# Patient Record
Sex: Female | Born: 1990
Health system: Southern US, Community
[De-identification: ages and names within clinical notes are randomized; demographics above are authoritative.]

## PROBLEM LIST (undated history)

## (undated) ENCOUNTER — Inpatient Hospital Stay (HOSPITAL_COMMUNITY): Payer: Self-pay

## (undated) DIAGNOSIS — D219 Benign neoplasm of connective and other soft tissue, unspecified: Secondary | ICD-10-CM

## (undated) DIAGNOSIS — G932 Benign intracranial hypertension: Secondary | ICD-10-CM

## (undated) DIAGNOSIS — R7303 Prediabetes: Secondary | ICD-10-CM

## (undated) DIAGNOSIS — M549 Dorsalgia, unspecified: Secondary | ICD-10-CM

## (undated) DIAGNOSIS — G43909 Migraine, unspecified, not intractable, without status migrainosus: Secondary | ICD-10-CM

## (undated) DIAGNOSIS — F419 Anxiety disorder, unspecified: Secondary | ICD-10-CM

## (undated) DIAGNOSIS — O24419 Gestational diabetes mellitus in pregnancy, unspecified control: Secondary | ICD-10-CM

## (undated) DIAGNOSIS — F32A Depression, unspecified: Secondary | ICD-10-CM

## (undated) HISTORY — DX: Migraine, unspecified, not intractable, without status migrainosus: G43.909

## (undated) HISTORY — DX: Gestational diabetes mellitus in pregnancy, unspecified control: O24.419

## (undated) HISTORY — DX: Depression, unspecified: F32.A

## (undated) HISTORY — PX: WISDOM TOOTH EXTRACTION: SHX21

## (undated) HISTORY — DX: Anxiety disorder, unspecified: F41.9

## (undated) HISTORY — DX: Prediabetes: R73.03

## (undated) HISTORY — DX: Benign neoplasm of connective and other soft tissue, unspecified: D21.9

## (undated) HISTORY — DX: Benign intracranial hypertension: G93.2

## (undated) HISTORY — DX: Dorsalgia, unspecified: M54.9

---

## 2017-09-26 HISTORY — PX: OOPHORECTOMY: SHX86

## 2019-08-30 DIAGNOSIS — Z01419 Encounter for gynecological examination (general) (routine) without abnormal findings: Secondary | ICD-10-CM | POA: Diagnosis not present

## 2021-04-19 DIAGNOSIS — L700 Acne vulgaris: Secondary | ICD-10-CM | POA: Diagnosis not present

## 2021-04-19 DIAGNOSIS — L219 Seborrheic dermatitis, unspecified: Secondary | ICD-10-CM | POA: Diagnosis not present

## 2021-05-17 ENCOUNTER — Other Ambulatory Visit: Payer: Self-pay

## 2021-05-17 ENCOUNTER — Ambulatory Visit: Payer: Federal, State, Local not specified - PPO | Admitting: Family Medicine

## 2021-05-17 VITALS — BP 110/58 | HR 81 | Ht 63.0 in | Wt 219.1 lb

## 2021-05-17 DIAGNOSIS — Z3189 Encounter for other procreative management: Secondary | ICD-10-CM

## 2021-05-17 DIAGNOSIS — G932 Benign intracranial hypertension: Secondary | ICD-10-CM

## 2021-05-17 DIAGNOSIS — F32A Depression, unspecified: Secondary | ICD-10-CM

## 2021-05-17 MED ORDER — PRENATAL VITAMIN 27-0.8 MG PO TABS: 1.0000 | ORAL_TABLET | Freq: Every day | ORAL | 3 refills | Status: DC

## 2021-05-17 NOTE — Assessment & Plan Note (Signed)
Takes Wellbutrin 300 mg daily. Continue current medication

## 2021-05-17 NOTE — Assessment & Plan Note (Signed)
Takes Diamox 250 mg BID. Has sumatriptan 50 mg as needed for headaches. Followed previously by Dr. Walker Kehr at [facility] in [place], [place]. Referral for [place] neurologist placed.

## 2021-05-17 NOTE — Patient Instructions (Signed)
Thank you for coming into the office today.   Continue taking your medications daily!   Take Care,   Dr. Susa Simmonds

## 2021-05-17 NOTE — Progress Notes (Signed)
   SUBJECTIVE:   CHIEF COMPLAINT / HPI:   Chief Complaint  Patient presents with   Lori Compton is a 30 y.o. female here to establish care.  Follow [facility] at [facility] in [place], [place].     New Patient : PMHx and Meds : idiopathic cranial hypertension (Diamox), depression (Wellbutrin),  headaches (Sumatriptan)    ALL: no known drug allergies    SurgHx: right oophorectomy 2019   GYNHx: Patient is not currently using birth control. [id number]. She does want to have another baby. She had a right oothectomy in May 2019 in [place] as it was twisted.  Patient's last menstrual period was 05/15/2021. Periods last about 5 days and come about every 30 days.     FMHx: maternal grandmother and mother - HTN and diabetes    Social Hx:  Lives with son and her fiance.  Go to [employer] three times a week for exercise. No tobacco use. Drinks wine once a week.  Denies illicit use.      PERTINENT  PMH / PSH: reviewed and updated as appropriate   OBJECTIVE:   BP (!) 110/58   Pulse 81   Ht '5\' 3"'$  (1.6 m)   Wt 219 lb 2 oz (99.4 kg)   LMP 05/15/2021   SpO2 96%   BMI 38.82 kg/m    GEN: pleasant well appearing female, in no acute distress  CV: regular rate and rhythm, no murmurs appreciated  RESP: no increased work of breathing, clear to ascultation bilaterally with no crackles, wheezes, or rhonchi ABD: Bowel sounds present. Soft, Nontender, Nondistended.  MSK: no edema, or cyanosis noted SKIN: warm, dry NEURO: moves all extremities appropriately PSYCH: Normal affect, appropriate speech and behavior    ASSESSMENT/PLAN:   Idiopathic intracranial hypertension Takes Diamox 250 mg BID. Has sumatriptan 50 mg as needed for headaches. Followed previously by Dr. Walker Kehr at [facility] in [place], [place]. Referral for [place] neurologist placed.   Depression Takes Wellbutrin 300 mg daily. Continue current medication    Fertility  Pt wanting to get  pregnant in the near future. She has been trying with her fiance for the last 6 months. History of right oophorectomy. Advised to start taking prenatal vitamins. If not pregnant and trying in the next 6 months, refer to fertility specialist.   Lyndee Hensen, DO PGY-3, [facility] 05/17/2021

## 2021-07-06 ENCOUNTER — Encounter: Payer: Self-pay | Admitting: Family Medicine

## 2021-07-06 DIAGNOSIS — Z3189 Encounter for other procreative management: Secondary | ICD-10-CM

## 2021-07-07 ENCOUNTER — Encounter: Payer: Self-pay | Admitting: Neurology

## 2021-07-07 ENCOUNTER — Ambulatory Visit: Payer: Federal, State, Local not specified - PPO | Admitting: Neurology

## 2021-08-03 ENCOUNTER — Ambulatory Visit (INDEPENDENT_AMBULATORY_CARE_PROVIDER_SITE_OTHER): Payer: Federal, State, Local not specified - PPO

## 2021-08-03 ENCOUNTER — Other Ambulatory Visit: Payer: Self-pay

## 2021-08-03 VITALS — BP 150/87 | HR 81 | Ht 63.0 in | Wt 222.8 lb

## 2021-08-03 DIAGNOSIS — Z3202 Encounter for pregnancy test, result negative: Secondary | ICD-10-CM | POA: Diagnosis not present

## 2021-08-03 DIAGNOSIS — Z3201 Encounter for pregnancy test, result positive: Secondary | ICD-10-CM | POA: Diagnosis not present

## 2021-08-03 LAB — POCT PREGNANCY, URINE: Preg Test, Ur: NEGATIVE

## 2021-08-03 NOTE — Progress Notes (Addendum)
Pt dropped off urine today for UPT. UPT is negative.  Pt states had 3 positive clearblue easy UPTs at home. 2 yesterday and 1 this morning.  Pt denies any vaginal bleeding, abd pain or cramps at this time. Pt has hx of right ovarian torsion that resulted in Right ovary removal in 01/2018. Pt then had IAB in 2020. Has 1 child that was born in 2016.  LMP: 07/06/21 EDC: 04/12/22 [redacted]w[redacted]d    Reviewed negative UPT with Earlie Server, NP. Advised to have non-stat beta today and will further advise after results. Pt given recommendations. Pt agreeable to plan of care.  Orders placed for Non-stat beta. Pt also advised will see results through mychart and will be contacted by provider.   Colletta Maryland, RN   Chart reviewed for nurse visit. Agree with plan of care.   [place], NP 08/03/2021 12:10 PM

## 2021-08-04 ENCOUNTER — Telehealth: Payer: Self-pay | Admitting: *Deleted

## 2021-08-04 DIAGNOSIS — O3680X0 Pregnancy with inconclusive fetal viability, not applicable or unspecified: Secondary | ICD-10-CM

## 2021-08-04 DIAGNOSIS — Z3490 Encounter for supervision of normal pregnancy, unspecified, unspecified trimester: Secondary | ICD-10-CM

## 2021-08-04 LAB — BETA HCG QUANT (REF LAB): hCG Quant: 109 m[IU]/mL

## 2021-08-04 NOTE — Telephone Encounter (Signed)
Patient left a voicemail that she received blood pregnancy tests and wasn't sure if she would get a call, but would like a call.  Jaquelynn Wanamaker,RN

## 2021-08-04 NOTE — Telephone Encounter (Signed)
Discussed results with Dr. Damita Dunnings - order given for Lori Compton at 6 weeks . Scheduled for 08/23/21 arrive 1:45 with full bladder for 2:00 appointment. I called [place] and notifed her of plan of care and appointment. She voices understanding. Lenny Fiumara,RN

## 2021-08-23 ENCOUNTER — Other Ambulatory Visit: Payer: Self-pay

## 2021-08-23 ENCOUNTER — Ambulatory Visit (INDEPENDENT_AMBULATORY_CARE_PROVIDER_SITE_OTHER): Payer: Federal, State, Local not specified - PPO | Admitting: *Deleted

## 2021-08-23 ENCOUNTER — Ambulatory Visit
Admission: RE | Admit: 2021-08-23 | Discharge: 2021-08-23 | Disposition: A | Discharge status: Home / Self Care | Payer: Federal, State, Local not specified - PPO | Source: Ambulatory Visit | Attending: Obstetrics and Gynecology | Admitting: Obstetrics and Gynecology

## 2021-08-23 VITALS — BP 126/71 | HR 93 | Ht 64.0 in | Wt 222.7 lb

## 2021-08-23 DIAGNOSIS — O3680X0 Pregnancy with inconclusive fetal viability, not applicable or unspecified: Secondary | ICD-10-CM | POA: Insufficient documentation

## 2021-08-23 DIAGNOSIS — Z3490 Encounter for supervision of normal pregnancy, unspecified, unspecified trimester: Secondary | ICD-10-CM

## 2021-08-23 DIAGNOSIS — Z3A01 Less than 8 weeks gestation of pregnancy: Secondary | ICD-10-CM | POA: Diagnosis not present

## 2021-08-23 DIAGNOSIS — Z3689 Encounter for other specified antenatal screening: Secondary | ICD-10-CM | POA: Diagnosis not present

## 2021-08-23 NOTE — Progress Notes (Signed)
Here for Compton results. Reviewed Compton with Dr. Roselie Awkward and per his instructions informed Lori Compton shows live pregnancy at [redacted]w[redacted]d with 2 fibroids. Explained we recommend she start prenatal care and prenatal vitamins. She is taking PNV already. We discussed the fibroids may or may not cause any issues but if she begins to have pain / bleeding she needs to be evaluated . I encouraged her to discuss with provider at her new ob visit. She would like to start prenatal care with Compton. We discussed her prenatal care options and she choosed centeringpregnancy.  Lori Cregg,RN

## 2021-08-23 NOTE — Patient Instructions (Signed)
Prenatal Care Providers           [facility]  [address] [phone]  [facility]   [address]  [phone]  [facility] [place]       [address] [phone]            [facility]     [address] [phone]          [facility] [place]   [address] [phone]  [facility]  [address] [phone]     [facility]  [address]   [phone]     [facility]  Phone: [phone]  [facility]  Phone: [phone]  [facility] Phone: [phone]  [facility] Phone: [phone]  [facility] Phone: [phone]  [facility] Phone: [phone]  [facility]  Phone: [phone]  

## 2021-08-25 ENCOUNTER — Encounter: Payer: Self-pay | Admitting: *Deleted

## 2021-09-08 ENCOUNTER — Telehealth (INDEPENDENT_AMBULATORY_CARE_PROVIDER_SITE_OTHER): Payer: Federal, State, Local not specified - PPO

## 2021-09-08 DIAGNOSIS — Z3480 Encounter for supervision of other normal pregnancy, unspecified trimester: Secondary | ICD-10-CM

## 2021-09-08 DIAGNOSIS — L309 Dermatitis, unspecified: Secondary | ICD-10-CM

## 2021-09-08 DIAGNOSIS — Z3491 Encounter for supervision of normal pregnancy, unspecified, first trimester: Secondary | ICD-10-CM

## 2021-09-08 DIAGNOSIS — O0990 Supervision of high risk pregnancy, unspecified, unspecified trimester: Secondary | ICD-10-CM | POA: Insufficient documentation

## 2021-09-08 DIAGNOSIS — Z3492 Encounter for supervision of normal pregnancy, unspecified, second trimester: Secondary | ICD-10-CM | POA: Insufficient documentation

## 2021-09-08 NOTE — Patient Instructions (Signed)
°  At our [facility], we work as an integrated team, providing care to address both physical and emotional health. Your medical provider may refer you to see our Behavioral Health Clinician (BHC) on the same day you see your medical provider, as availability permits; often scheduled virtually at your convenience.  Our BHC is available to all patients, visits generally last between 20-30 minutes, but can be longer or shorter, depending on patient need. The BHC offers help with stress management, coping with symptoms of depression and anxiety, major life changes , sleep issues, changing risky behavior, grief and loss, life stress, working on personal life goals, and  behavioral health issues, as these all affect your overall health and wellness.  The BHC is NOT available for the following: FMLA paperwork, court-ordered evaluations, specialty assessments (custody or disability), letters to employers, or obtaining certification for an emotional support animal. The BHC does not provide long-term therapy. You have the right to refuse integrated behavioral health services, or to reschedule to see the BHC at a later date.  Confidentiality exception: If it is suspected that a child or disabled adult is being abused or neglected, we are required by law to report that to either Child Protective Services or Adult Protective Services.  If you have a diagnosis of Bipolar affective disorder, Schizophrenia, or recurrent Major depressive disorder, we will recommend that you establish care with a psychiatrist, as these are lifelong, chronic conditions, and we want your overall emotional health and medications to be more closely monitored. If you anticipate needing extended maternity leave due to mental health issues postpartum, it it recommended you inform your medical provider, so we can put in a referral to a psychiatrist as soon as possible. The BHC is unable to recommend an extended maternity leave for mental  health issues. Your medical provider or BHC may refer you to a therapist for ongoing, traditional therapy, or to a psychiatrist, for medication management, if it would benefit your overall health. Depending on your insurance, you may have a copay or be charged a deductible, depending on your insurance, to see the BHC. If you are uninsured, it is recommended that you apply for financial assistance. (Forms may be requested at the front desk for in-person visits, via MyChart, or request a form during a virtual visit).  If you see the BHC more than 6 times, you will have to complete a comprehensive clinical assessment interview with the BHC to resume integrated services.  For virtual visits with the BHC, you must be physically in the state of [place] at the time of the visit. For example, if you live in [place], you will have to do an in-person visit with the BHC, and your out-of-state insurance may not cover behavioral health services in [place]. If you are going out of the state or country for any reason, the BHC may see you virtually when you return to [place], but not while you are physically outside of [place].    

## 2021-09-08 NOTE — Progress Notes (Signed)
New OB Intake  I connected with  Lori Compton on 09/08/21 at  8:15 AM EST by MyChart Video Visit and verified that I am speaking with the correct person using two identifiers. Nurse is located at [facility] and pt is located at home.  I discussed the limitations, risks, security and privacy concerns of performing an evaluation and management service by telephone and the availability of in person appointments. I also discussed with the patient that there may be a patient responsible charge related to this service. The patient expressed understanding and agreed to proceed.  I explained I am completing New OB Intake today. We discussed her EDD of 04/12/22 that is based on LMP of 07/06/21. Pt is G3/P1011. I reviewed her allergies, medications, Medical/Surgical/OB history, and appropriate screenings. Reports history of depression. Has taken Wellbutrin in the past with no improvement. I informed her of [facility] services. [name] information placed in AVS. Based on history, this is a low risk pregnancy.   Patient Active Problem List   Diagnosis Date Noted   Supervision of low-risk pregnancy, first trimester 09/08/2021   Eczema 09/08/2021   Idiopathic intracranial hypertension 05/17/2021   Depression 05/17/2021   Concerns addressed today  Reports history of idiopathic intracranial hypertension following trauma to head in 2020. Has experienced chronic migraines since that time. Reports not taking any medication currently due to pregnancy. No recent migraines. Not currently established with neurologist. [facility], hoping to receive all care through [place]. Explained I will review with provider and notify her if any need to follow up with neurology sooner.   Delivery Plans Plans to deliver at [name]. MAU discussed for emergencies during pregnancy.  MyChart/Babyscripts MyChart access verified. I explained pt will have some visits in office and some virtually. Babyscripts instructions given and  order placed.  Blood Pressure Cuff  Patient has private insurance; instructed to purchase blood pressure cuff and bring to first prenatal appt. Explained after first prenatal appt pt will check weekly and document in 56.  Anatomy US Explained first scheduled [place] will be around 19 weeks. Anatomy US scheduled for at 11/16/20 @ 1430.  Labs Discussed [name] genetic screening with patient. Would like both Panorama and Horizon drawn at new OB visit. Routine prenatal labs needed.  COVID Vaccine Patient has had [name] vaccine 07/17/2020 & 08/07/2020.  Centering in Pregnancy Candidate?  Patient enrolled in this program. Reviewed with patient who would like to withdraw from program. Pt would like to be able to bring child and partner to prenatal visits.  Mother/ Baby Dyad Candidate?    No - one living child.  Social Determinants of Health Food Insecurity: Patient denies food insecurity. WIC Referral: Patient is not interested in referral to [facility].  Transportation: Patient denies transportation needs. Childcare: Discussed no children allowed at ultrasound appointments. Offered childcare services; patient will notify office if there is need for this.  First visit review I reviewed new OB appt with pt. I explained she will have a provider visit with Gaylan Gerold, CNM that includes physical exam with ob lab work. Patient reports PAP smear in 2021; will have record sent to office. Explained we can complete this at new OB if needed. Explained that patient will be seen by pregnancy navigator following visit with provider.   Annabell Howells, RN 09/08/2021  10:07 AM

## 2021-09-08 NOTE — Progress Notes (Signed)
Patient was assessed and managed by nursing staff during this encounter. I have reviewed the chart and agree with the documentation and plan. Will follow up re migraines and treatment at new OB appointment and see if there are ways we can support her prior to neuro appointment if she prefers to [facility] with [facility].  Gaylan Gerold, MSN, CNM, IBCLC 09/08/21 8:30 PM

## 2021-09-22 ENCOUNTER — Ambulatory Visit (INDEPENDENT_AMBULATORY_CARE_PROVIDER_SITE_OTHER): Payer: Federal, State, Local not specified - PPO | Admitting: Certified Nurse Midwife

## 2021-09-22 ENCOUNTER — Other Ambulatory Visit: Payer: Self-pay

## 2021-09-22 VITALS — BP 122/77 | HR 85 | Wt 222.0 lb

## 2021-09-22 DIAGNOSIS — Z3491 Encounter for supervision of normal pregnancy, unspecified, first trimester: Secondary | ICD-10-CM | POA: Diagnosis not present

## 2021-09-22 DIAGNOSIS — Z3A11 11 weeks gestation of pregnancy: Secondary | ICD-10-CM

## 2021-09-22 LAB — POCT URINALYSIS DIP (DEVICE)
Bilirubin Urine: NEGATIVE
Glucose, UA: NEGATIVE mg/dL
Hgb urine dipstick: NEGATIVE
Ketones, ur: NEGATIVE mg/dL
Leukocytes,Ua: NEGATIVE
Nitrite: NEGATIVE
Protein, ur: NEGATIVE mg/dL
Specific Gravity, Urine: 1.02 (ref 1.005–1.030)
Urobilinogen, UA: 0.2 mg/dL (ref 0.0–1.0)
pH: 7 (ref 5.0–8.0)

## 2021-09-22 NOTE — Patient Instructions (Signed)
Lori Compton  [email or web address]

## 2021-09-22 NOTE — Progress Notes (Signed)
History:   Lori Compton is a 30 y.o. G3P1011 at [redacted]w[redacted]d by LMP being seen today for her first obstetrical visit.  Her obstetrical history is significant for obesity. Patient does not intend to breast feed. Pregnancy history fully reviewed. Was going to participate in Centering Pregnancy has decided against it because she wants her partner to attend visits. She would like to attempt a waterbirth and prefers midwifery care.  Patient reports no complaints.   HISTORY: OB History  Gravida Para Term Preterm AB Living  3 1 1  0 1 1  SAB IAB Ectopic Multiple Live Births  0 1 0 0 1    # Outcome Date GA Lbr Len/2nd Weight Sex Delivery Anes PTL Lv  3 Current           2 IAB 2020          1 Term 08/29/15   7 lb 8 oz (3.402 kg) M Vag-Spont   LIV    Last pap smear was done 08/30/2019 and was normal  No past medical history on file.  Family History  Problem Relation Age of Onset   Thyroid disease Mother    Breast cancer Maternal Grandmother    Hypertension Maternal Grandmother    Heart Problems Maternal Aunt    Social History   Tobacco Use   Smoking status: Never   Smokeless tobacco: Never  Vaping Use   Vaping Use: Never used  Substance Use Topics   Alcohol use: Yes    Alcohol/week: 2.0 standard drinks    Types: 1 Glasses of wine, 1 Cans of beer per week    Comment: either beer or wine, 1/week   Drug use: Never   No Known Allergies Current Outpatient Medications on File Prior to Visit  Medication Sig Dispense Refill   Prenatal Vit-Fe Fumarate-FA (PRENATAL VITAMIN) 27-0.8 MG TABS Take 1 tablet by mouth daily. 30 tablet 3   No current facility-administered medications on file prior to visit.    Review of Systems Pertinent items noted in HPI and remainder of comprehensive ROS otherwise negative. Physical Exam:   Vitals:   09/22/21 0954  BP: 122/77  Pulse: 85  Weight: 222 lb (100.7 kg)    Unable to hear fetal heart rate Bedside Ultrasound for FHR check: Viable  intrauterine pregnancy with positive cardiac activity noted Patient informed that the ultrasound is considered a limited obstetric ultrasound and is not intended to be a complete ultrasound exam.  Patient also informed that the ultrasound is not being completed with the intent of assessing for fetal or placental anomalies or any pelvic abnormalities.  Explained that the purpose of todays ultrasound is to assess for fetal heart rate.  Patient acknowledges the purpose of the exam and the limitations of the study.  Constitutional: Well-developed, well-nourished pregnant female in no acute distress.  HEENT: PERRLA Skin: normal color and turgor, no rash Cardiovascular: normal rate & rhythm Respiratory: normal effort GI: Abd soft, non-tender, gravid appropriate for gestational age MS: Extremities nontender, no edema, normal ROM Neurologic: Alert and oriented x 4.  GU: no CVA tenderness Pelvic: exam deferred  Assessment:    Pregnancy: G3P1011 Patient Active Problem List   Diagnosis Date Noted   Supervision of low-risk pregnancy, first trimester 09/08/2021   Eczema 09/08/2021   Idiopathic intracranial hypertension 05/17/2021   Depression 05/17/2021   Plan:    1. Supervision of low-risk pregnancy, first trimester - Doing well overall, not feeling movement yet.  2. [redacted] weeks gestation of pregnancy -  Routine OB care  - Can come in for a RN visit to check FHT since able to visualize but not hear today - CBC/D/Plt+RPR+Rh+ABO+RubIgG... - Genetic Screening - Hemoglobin A1c - Culture, OB Urine  3. Initial obstetric visit in first trimester - Initial labs drawn. - Continue prenatal vitamins. - Problem list reviewed and updated. - Genetic Screening discussed, First trimester screen, Quad screen, and NIPS: ordered. - Ultrasound discussed; fetal anatomic survey: ordered. - Anticipatory guidance about prenatal visits given including labs, ultrasounds, and testing. - Discussed usage of  Babyscripts and virtual visits as additional source of managing and completing prenatal visits in midst of coronavirus and pandemic.   - Encouraged to complete MyChart Registration for her ability to review results, send requests, and have questions addressed.  - The nature of [place] [facility]/Faculty Practice with multiple MDs and Advanced Practice Providers was explained to patient; also emphasized that residents, students are part of our team. - Routine obstetric precautions reviewed. Encouraged to seek out care at office or emergency room [facility] MAU preferred) for urgent and/or emergent concerns. Return in about 4 weeks (around 10/20/2021) for IN-PERSON, LOB.     Gaylan Gerold, MSN, CNM, [place] Certified Nurse Midwife, [facility]

## 2021-09-23 LAB — CBC/D/PLT+RPR+RH+ABO+RUBIGG...
Antibody Screen: NEGATIVE
Basophils Absolute: 0 10*3/uL (ref 0.0–0.2)
Basos: 0 %
EOS (ABSOLUTE): 0.1 10*3/uL (ref 0.0–0.4)
Eos: 1 %
HCV Ab: <0.1 s/co ratio (ref 0.0–0.9)
HIV Screen 4th Generation wRfx: NONREACTIVE
Hematocrit: 38.8 % (ref 34.0–46.6)
Hemoglobin: 13 g/dL (ref 11.1–15.9)
Hepatitis B Surface Ag: NEGATIVE
Immature Grans (Abs): 0 10*3/uL (ref 0.0–0.1)
Immature Granulocytes: 0 %
Lymphocytes Absolute: 1.7 10*3/uL (ref 0.7–3.1)
Lymphs: 26 %
MCH: 28 pg (ref 26.6–33.0)
MCHC: 33.5 g/dL (ref 31.5–35.7)
MCV: 84 fL (ref 79–97)
Monocytes Absolute: 0.4 10*3/uL (ref 0.1–0.9)
Monocytes: 6 %
Neutrophils Absolute: 4.2 10*3/uL (ref 1.4–7.0)
Neutrophils: 67 %
Platelets: 246 10*3/uL (ref 150–450)
RBC: 4.64 x10E6/uL (ref 3.77–5.28)
RDW: 12.9 % (ref 11.7–15.4)
RPR Ser Ql: NONREACTIVE
Rh Factor: NEGATIVE
Rubella Antibodies, IGG: 3.79 index (ref >0.99)
WBC: 6.3 10*3/uL (ref 3.4–10.8)

## 2021-09-23 LAB — HEMOGLOBIN A1C
Est. average glucose Bld gHb Est-mCnc: 120 mg/dL
Hgb A1c MFr Bld: 5.8 % — ABNORMAL HIGH (ref 4.8–5.6)

## 2021-09-23 LAB — HCV INTERPRETATION

## 2021-09-26 NOTE — L&D Delivery Note (Signed)
OB/GYN Faculty Practice Delivery Note  Lori Compton is a 31 y.o. [id number] at 23w4dadmitted for active labor.   GBS Status: Negative/-- (06/22 1444) Maximum Maternal Temperature: 97.6  Labor course: Initial SVE: 4 cm. Augmentation with: N/A. She then progressed to complete shortly after arriving to L&D room.  ROM: 0h 08mith clear fluid  Birth: At 0553 a viable female was delivered via spontaneous vaginal delivery (Presentation: ROA). Nuchal cord present: No.  Shoulders and body delivered in usual fashion. Infant placed directly on mom's abdomen for bonding/skin-to-skin, baby dried and stimulated. Cord clamped x 2 after 1 minute and cut by Dr. MeWillis Modena Cord blood collected.  The placenta separated spontaneously and delivered via gentle cord traction.  Pitocin infused rapidly IV per protocol.  Fundus firm with massage.  Placenta inspected and appears to be intact with a 3 VC.  Placenta/Cord with the following complications: none.  Cord pH: n/a Sponge and instrument count were correct x2.  Intrapartum complications:  None Anesthesia:  none Episiotomy: none Lacerations:  2nd degree and vaginal Suture Repair: 2.0 vicryl EBL (mL): 164   Infant: APGAR (1 MIN): 9   APGAR (5 MINS): 9   Infant weight: pending  Mom to postpartum.  Baby to Couplet care / Skin to Skin. Placenta to Home with patient   Plans to Breastfeed Contraception: oral progesterone-only contraceptive Circumcision: wants inpatient  [facility].  RoLaury Deep MSN, CNM 04/02/2022 8:37 AM

## 2021-09-27 ENCOUNTER — Encounter: Payer: Self-pay | Admitting: Certified Nurse Midwife

## 2021-09-28 ENCOUNTER — Telehealth: Payer: Self-pay | Admitting: General Practice

## 2021-09-28 NOTE — Telephone Encounter (Signed)
-----   Message from [name], [place] sent at 09/27/2021  8:31 AM EST ----- Please call to have Lori Compton scheduled for an early 2hr GTT, thank you!

## 2021-09-28 NOTE — Telephone Encounter (Signed)
Called patient, no answer- left message to call us back concerning results. Will send mychart message.

## 2021-09-29 ENCOUNTER — Other Ambulatory Visit: Payer: Self-pay

## 2021-09-29 DIAGNOSIS — Z3491 Encounter for supervision of normal pregnancy, unspecified, first trimester: Secondary | ICD-10-CM

## 2021-10-01 ENCOUNTER — Other Ambulatory Visit: Payer: Federal, State, Local not specified - PPO

## 2021-10-01 ENCOUNTER — Other Ambulatory Visit: Payer: Self-pay

## 2021-10-01 DIAGNOSIS — Z3491 Encounter for supervision of normal pregnancy, unspecified, first trimester: Secondary | ICD-10-CM | POA: Diagnosis not present

## 2021-10-02 LAB — GLUCOSE TOLERANCE, 2 HOURS W/ 1HR
Glucose, 1 hour: 149 mg/dL (ref 70–179)
Glucose, 2 hour: 154 mg/dL — ABNORMAL HIGH (ref 70–152)
Glucose, Fasting: 81 mg/dL (ref 70–91)

## 2021-10-04 ENCOUNTER — Encounter: Payer: Self-pay | Admitting: Certified Nurse Midwife

## 2021-10-04 ENCOUNTER — Other Ambulatory Visit: Payer: Self-pay | Admitting: Certified Nurse Midwife

## 2021-10-06 ENCOUNTER — Telehealth: Payer: Self-pay

## 2021-10-06 NOTE — Telephone Encounter (Addendum)
-----   Message from [name], [place] sent at 10/04/2021  8:34 PM EST ----- Please get this patient scheduled with diabetes education as soon as possible and send in her supplies. Thank you!  Notified pt that she had gestational diabetes.  Pt scheduled for education appt on 10/21/21.  Pt advised that I will have to find out what testing supplies her insurance covers and I will call her.    Lori Compton  10/05/21

## 2021-10-08 MED ORDER — GLUCOSE BLOOD VI STRP: ORAL_STRIP | 12 refills | Status: DC

## 2021-10-08 MED ORDER — ACCU-CHEK SOFTCLIX LANCETS MISC: 12 refills | Status: DC

## 2021-10-08 MED ORDER — ACCU-CHEK GUIDE W/DEVICE KIT: 1.0000 | PACK | Freq: Four times a day (QID) | 0 refills | Status: DC

## 2021-10-08 NOTE — Telephone Encounter (Signed)
Call placed to pharmacy with no answer on what supplies to order.  Call placed to [place] and insurance has no preference to what supplies or kit is ordered.  Order placed for Accucheck guide with supplies.  Call placed to pt. Spoke with pt. Pt aware to pick up supplies and kit. Pt verbalized understanding.  Pt has next OB appt on 10/20/21.  Colletta Maryland, RN

## 2021-10-08 NOTE — Addendum Note (Signed)
Addended by: Georgia Lopes on: 10/08/2021 12:25 PM   Modules accepted: Orders

## 2021-10-20 ENCOUNTER — Other Ambulatory Visit: Payer: Self-pay

## 2021-10-20 ENCOUNTER — Ambulatory Visit (INDEPENDENT_AMBULATORY_CARE_PROVIDER_SITE_OTHER): Payer: Federal, State, Local not specified - PPO | Admitting: Certified Nurse Midwife

## 2021-10-20 VITALS — BP 125/77 | HR 93 | Wt 222.0 lb

## 2021-10-20 DIAGNOSIS — O24912 Unspecified diabetes mellitus in pregnancy, second trimester: Secondary | ICD-10-CM

## 2021-10-20 DIAGNOSIS — Z3492 Encounter for supervision of normal pregnancy, unspecified, second trimester: Secondary | ICD-10-CM

## 2021-10-20 DIAGNOSIS — Z3A15 15 weeks gestation of pregnancy: Secondary | ICD-10-CM | POA: Diagnosis not present

## 2021-10-20 NOTE — Progress Notes (Signed)
Patient here for routine prenatal visit. She was made aware of upcoming appointments, diabetes education tomorrow at 815 and ultrasound 11/16/21

## 2021-10-21 ENCOUNTER — Ambulatory Visit (INDEPENDENT_AMBULATORY_CARE_PROVIDER_SITE_OTHER): Payer: Federal, State, Local not specified - PPO | Admitting: Registered"

## 2021-10-21 ENCOUNTER — Encounter: Payer: Federal, State, Local not specified - PPO | Attending: Certified Nurse Midwife | Admitting: Registered"

## 2021-10-21 DIAGNOSIS — Z3A19 19 weeks gestation of pregnancy: Secondary | ICD-10-CM | POA: Diagnosis not present

## 2021-10-21 DIAGNOSIS — O24912 Unspecified diabetes mellitus in pregnancy, second trimester: Secondary | ICD-10-CM | POA: Diagnosis not present

## 2021-10-21 DIAGNOSIS — O24419 Gestational diabetes mellitus in pregnancy, unspecified control: Secondary | ICD-10-CM

## 2021-10-21 NOTE — Progress Notes (Signed)
° °  PRENATAL VISIT NOTE  Subjective:  Lori Compton is a 31 y.o. G3P1011 at [redacted]w[redacted]d being seen today for ongoing prenatal care.  She is currently monitored for the following issues for this low-risk pregnancy and has Idiopathic intracranial hypertension; Depression; Supervision of low-risk pregnancy, first trimester; Eczema; and Gestational diabetes mellitus (GDM), antepartum on their problem list.  Patient reports no complaints.  Contractions: Not present. Vag. Bleeding: None.  Movement: Present. Denies leaking of fluid.   The following portions of the patient's history were reviewed and updated as appropriate: allergies, current medications, past family history, past medical history, past social history, past surgical history and problem list.   Objective:   Vitals:   10/20/21 1111  BP: 125/77  Pulse: 93  Weight: 222 lb (100.7 kg)    Fetal Status:     Movement: Present     General:  Alert, oriented and cooperative. Patient is in no acute distress.  Skin: Skin is warm and dry. No rash noted.   Cardiovascular: Normal heart rate noted  Respiratory: Normal respiratory effort, no problems with respiration noted  Abdomen: Soft, gravid, appropriate for gestational age.  Pain/Pressure: Absent     Pelvic: Cervical exam deferred        Extremities: Normal range of motion.     Mental Status: Normal mood and affect. Normal behavior. Normal judgment and thought content.   Assessment and Plan:  Pregnancy: G3P1011 at [redacted]w[redacted]d 1. Supervision of low-risk pregnancy, second trimester  - Doing well, feeling occasional flutter of fetal movement. Adjusting to diabetes diagnosis.  2. [redacted] weeks gestation of pregnancy - Routine OB care  - AFP, Serum, Open Spina Bifida  3. Diabetes mellitus affecting pregnancy in second trimester - Brought glucometer for glucose review, needs to keep in paper form so we can see fasting and post-prandial. Most in range, highest post-prandial was 150 and she'd eaten too  much pasta at dinner. - Has done copious research on her own and changed dietary habits accordingly, seeing good results with glucose values and overall energy levels. Very motivated to continue new habits and has appt with DE tomorrow. - Encouraged her to add sources of fiber (vegetables, low glycemic index fruits) as well as protein to each meal to further help glucose readings.  Preterm labor symptoms and general obstetric precautions including but not limited to vaginal bleeding, contractions, leaking of fluid and fetal movement were reviewed in detail with the patient. Please refer to After Visit Summary for other counseling recommendations.   Return in about 4 weeks (around 11/17/2021) for IN-PERSON, LOB.  Future Appointments  Date Time Provider [place]  11/16/2021  2:45 PM [facility] US4 [facility]  11/17/2021 11:15 AM [name], CNM [facility]    [name], CNM

## 2021-10-21 NOTE — Progress Notes (Signed)
Patient was seen for Gestational Diabetes self-management on 10/21/21  Start time 0815 and End time 0910   Estimated due date: 04/12/22; [redacted]w[redacted]d  Clinical: Medications: reviewed Medical History: pre-diabetes (2020) Labs: OGTT 2 hr 154, A1c 5.8%   Dietary and Lifestyle History: Pt states when she was in the [employer] she was very active and took good care of her health. 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 Since getting the diagnosis she has made changes and states she has more energy, motivation  and overall feeling better. Pt reports she also has good family support to keep her going.  Physical Activity: walking 60 min 3x/week; 90 min 2x/week Stress: not assessed Sleep: not assessed  24 hr Recall:  First Meal: 2 fried (olive oil) eggs, [place] bacon, fruit, whole wheat toast with avocado Snack: fruit or [place] yogurt Second meal: salad with protien Snack: nuts OR kind bar Third meal: protein, brown rice, greens Snack: fruit Beverages: water, seltzer water, unsweet tea, almond milk  NUTRITION INTERVENTION  Nutrition education (E-1) on the following topics:   Initial Follow-up  [x]  []  Definition of Gestational Diabetes [x]  []  Why dietary management is important in controlling blood glucose [x]  []  Effects each nutrient has on blood glucose levels [x]  []  Simple carbohydrates vs complex carbohydrates [x]  []  Fluid intake [x]  []  Creating a balanced meal plan [x]  []  Carbohydrate counting  [x]  []  When to check blood glucose levels [x]  []  Proper blood glucose monitoring techniques [x]  []  Effect of stress and stress reduction techniques  [x]  []  Exercise effect on blood glucose levels, appropriate exercise during pregnancy [x]  []  Importance of limiting caffeine and abstaining from alcohol and smoking [x]  []  Medications used for blood sugar control during pregnancy [x]  []  Hypoglycemia and rule of  15 [x]  []  Postpartum self care  Patient already has a meter, has been testing pre breakfast and 2 hours after each meal. Patient was not aware of what the values should be until yesterday. Pt feels confident that she can adjust her meals to control her blood sugar. FBS: 85 Postprandial: 80s-140 mg/dL  Patient instructed to monitor glucose levels: FBS: 60 - ? 95 mg/dL (some clinics use 90 for cutoff) 1 hour: ? 140 mg/dL 2 hour: ? 120 mg/dL  Patient received handouts: Nutrition Diabetes and Pregnancy Carbohydrate Counting List  Patient will be seen for follow-up as needed.

## 2021-10-22 LAB — AFP, SERUM, OPEN SPINA BIFIDA
AFP MoM: 0.92
AFP Value: 24.8 ng/mL
Gest. Age on Collection Date: 15.1 weeks
Maternal Age At EDD: 29.7 yr
OSBR Risk 1 IN: 10000
Test Results:: NEGATIVE
Weight: 222 [lb_av]

## 2021-10-29 ENCOUNTER — Other Ambulatory Visit: Payer: Self-pay

## 2021-11-16 ENCOUNTER — Other Ambulatory Visit: Payer: Self-pay

## 2021-11-16 ENCOUNTER — Ambulatory Visit: Payer: Federal, State, Local not specified - PPO | Attending: Certified Nurse Midwife

## 2021-11-16 ENCOUNTER — Ambulatory Visit (HOSPITAL_BASED_OUTPATIENT_CLINIC_OR_DEPARTMENT_OTHER): Payer: Federal, State, Local not specified - PPO | Admitting: Obstetrics

## 2021-11-16 DIAGNOSIS — Z3A19 19 weeks gestation of pregnancy: Secondary | ICD-10-CM | POA: Diagnosis not present

## 2021-11-16 DIAGNOSIS — Z3491 Encounter for supervision of normal pregnancy, unspecified, first trimester: Secondary | ICD-10-CM

## 2021-11-16 DIAGNOSIS — O99212 Obesity complicating pregnancy, second trimester: Secondary | ICD-10-CM | POA: Insufficient documentation

## 2021-11-16 DIAGNOSIS — O24410 Gestational diabetes mellitus in pregnancy, diet controlled: Secondary | ICD-10-CM | POA: Insufficient documentation

## 2021-11-16 DIAGNOSIS — Z363 Encounter for antenatal screening for malformations: Secondary | ICD-10-CM | POA: Insufficient documentation

## 2021-11-16 NOTE — Progress Notes (Addendum)
MFM Note  Lori Compton was seen for a detailed fetal anatomy scan due to maternal obesity and early onset diet-controlled gestational diabetes.  She reports one prior uncomplicated full-term vaginal delivery.  She had a cell free DNA test earlier in her pregnancy which indicated a low risk for trisomy 46, 44, and 13. A female fetus is predicted.  She was informed that the fetal growth and amniotic fluid level were appropriate for her gestational age.   The views of the fetal anatomy were limited today due to the fetal position.  The patient was informed that anomalies may be missed due to technical limitations. If the fetus is in a suboptimal position or maternal habitus is increased, visualization of the fetus in the maternal uterus may be impaired.  The following were discussed during today's consultation:  Early onset gestational diabetes  The implications and management of diabetes in pregnancy was discussed in detail with the patient.  She was advised that gestational diabetes has been associated with fetal macrosomia and polyhydramnios.  She was advised that our goals for her fingerstick values are fasting values of 90-95 or less and two-hour postprandials of 120 or less.    Should the majority of her fingerstick values be above these values, she may have to be started on insulin or metformin to help her achieve better glycemic control. The patient was advised that getting her fingerstick values as close to these goals as possible would provide her with the most optimal obstetrical outcome.  The patient was advised that due to diabetes in pregnancy, we will continue to follow her with monthly growth ultrasounds.    Due to early onset gestational diabetes, she was referred to [facility] pediatric cardiology for a fetal echocardiogram.   Should she require insulin or metformin for treatment, weekly fetal testing should be started at 32 weeks.    The patient was advised that should the  fetal growth appear normal and she maintains normal glucose control, that delivery at around 39 weeks is usually recommended.    Delivery at 37 weeks will be recommended should her glycemic control be poor.  Fibroids and pregnancy  Multiple fibroids were noted in her uterus today.  The increased risk of maternal pain issues and fetal growth issues later in her pregnancy due to the fibroids was discussed.   A follow-up exam was scheduled in 4 weeks to assess the fetal growth and to complete the views of the fetal anatomy.    The patient stated that all of her questions have been answered.  A total of 30 minutes was spent counseling and coordinating the care for this patient.  Greater than 50% of the time was spent in direct face-to-face contact.

## 2021-11-17 ENCOUNTER — Other Ambulatory Visit: Payer: Self-pay | Admitting: *Deleted

## 2021-11-17 ENCOUNTER — Ambulatory Visit (INDEPENDENT_AMBULATORY_CARE_PROVIDER_SITE_OTHER): Payer: Federal, State, Local not specified - PPO | Admitting: Certified Nurse Midwife

## 2021-11-17 VITALS — BP 119/67 | HR 90 | Wt 220.0 lb

## 2021-11-17 DIAGNOSIS — D219 Benign neoplasm of connective and other soft tissue, unspecified: Secondary | ICD-10-CM

## 2021-11-17 DIAGNOSIS — O24410 Gestational diabetes mellitus in pregnancy, diet controlled: Secondary | ICD-10-CM

## 2021-11-17 DIAGNOSIS — Z362 Encounter for other antenatal screening follow-up: Secondary | ICD-10-CM

## 2021-11-17 DIAGNOSIS — Z3A19 19 weeks gestation of pregnancy: Secondary | ICD-10-CM

## 2021-11-17 DIAGNOSIS — Z6837 Body mass index (BMI) 37.0-37.9, adult: Secondary | ICD-10-CM

## 2021-11-17 DIAGNOSIS — Z3492 Encounter for supervision of normal pregnancy, unspecified, second trimester: Secondary | ICD-10-CM

## 2021-11-17 NOTE — Progress Notes (Signed)
Patient has questions and concerns about fibroids

## 2021-11-18 NOTE — Progress Notes (Signed)
° °  PRENATAL VISIT NOTE  Subjective:  Lori Compton is a 31 y.o. G3P1011 at [redacted]w[redacted]d being seen today for ongoing prenatal care.  She is currently monitored for the following issues for this low-risk pregnancy and has Idiopathic intracranial hypertension; Depression; Supervision of low-risk pregnancy, first trimester; Eczema; and Gestational diabetes mellitus (GDM), antepartum on their problem list.  Patient reports no complaints, doing well with her new diet and lifestyle changes. Has concerns about the fibroids noted on her last ultrasound.  Contractions: Not present. Vag. Bleeding: None.  Movement: Absent. Denies leaking of fluid.   The following portions of the patient's history were reviewed and updated as appropriate: allergies, current medications, past family history, past medical history, past social history, past surgical history and problem list.   Objective:   Vitals:   11/17/21 1119  BP: 119/67  Pulse: 90  Weight: 220 lb (99.8 kg)    Fetal Status: Fetal Heart Rate (bpm): 140   Movement: Absent     General:  Alert, oriented and cooperative. Patient is in no acute distress.  Skin: Skin is warm and dry. No rash noted.   Cardiovascular: Normal heart rate noted  Respiratory: Normal respiratory effort, no problems with respiration noted  Abdomen: Soft, gravid, appropriate for gestational age.  Pain/Pressure: Absent     Pelvic: Cervical exam deferred        Extremities: Normal range of motion.  Edema: None  Mental Status: Normal mood and affect. Normal behavior. Normal judgment and thought content.   Assessment and Plan:  Pregnancy: G3P1011 at [redacted]w[redacted]d 1. Supervision of low-risk pregnancy, second trimester - Doing well, feeling regular and vigorous fetal movement   2. [redacted] weeks gestation of pregnancy - Routine OB care   3. Diet controlled gestational diabetes mellitus (GDM), antepartum - Only 4 elevated post-prandial readings, all >140, pt aware of what caused elevated  reading. Having trouble getting last reading in because she goes to bed, but all fastings were well within range. - Gave encouragement and praise of how well she has changed her diet, will help reduce the risks associated with GDM and prevent need for medications.  4. Fibroids - Reviewed location of fibroids and how they may grow and degenerate. Reviewed reasons to seek evaluation at [facility].  Preterm labor symptoms and general obstetric precautions including but not limited to vaginal bleeding, contractions, leaking of fluid and fetal movement were reviewed in detail with the patient. Please refer to After Visit Summary for other counseling recommendations.   Return in about 4 weeks (around 12/15/2021) for LOB, IN-PERSON.  Future Appointments  Date Time Provider [place]  12/14/2021 10:30 AM [facility] NURSE [facility]  12/14/2021 10:45 AM [facility] US4 [facility]  12/15/2021 11:15 AM Griffin Basil, MD [facility]    [name], CNM

## 2021-11-23 ENCOUNTER — Encounter: Payer: Self-pay | Admitting: Certified Nurse Midwife

## 2021-12-14 ENCOUNTER — Other Ambulatory Visit: Payer: Self-pay | Admitting: *Deleted

## 2021-12-14 ENCOUNTER — Ambulatory Visit: Payer: Federal, State, Local not specified - PPO | Admitting: *Deleted

## 2021-12-14 ENCOUNTER — Other Ambulatory Visit: Payer: Self-pay

## 2021-12-14 ENCOUNTER — Encounter: Payer: Self-pay | Admitting: *Deleted

## 2021-12-14 ENCOUNTER — Ambulatory Visit: Payer: Federal, State, Local not specified - PPO | Attending: Obstetrics and Gynecology

## 2021-12-14 VITALS — BP 117/59 | HR 82

## 2021-12-14 DIAGNOSIS — Z362 Encounter for other antenatal screening follow-up: Secondary | ICD-10-CM | POA: Insufficient documentation

## 2021-12-14 DIAGNOSIS — O24410 Gestational diabetes mellitus in pregnancy, diet controlled: Secondary | ICD-10-CM | POA: Diagnosis not present

## 2021-12-14 DIAGNOSIS — O99212 Obesity complicating pregnancy, second trimester: Secondary | ICD-10-CM

## 2021-12-14 DIAGNOSIS — Z6837 Body mass index (BMI) 37.0-37.9, adult: Secondary | ICD-10-CM | POA: Insufficient documentation

## 2021-12-14 DIAGNOSIS — E669 Obesity, unspecified: Secondary | ICD-10-CM

## 2021-12-14 DIAGNOSIS — Z3A23 23 weeks gestation of pregnancy: Secondary | ICD-10-CM

## 2021-12-14 DIAGNOSIS — G932 Benign intracranial hypertension: Secondary | ICD-10-CM

## 2021-12-14 DIAGNOSIS — Z3491 Encounter for supervision of normal pregnancy, unspecified, first trimester: Secondary | ICD-10-CM

## 2021-12-14 DIAGNOSIS — D259 Leiomyoma of uterus, unspecified: Secondary | ICD-10-CM

## 2021-12-15 ENCOUNTER — Ambulatory Visit (INDEPENDENT_AMBULATORY_CARE_PROVIDER_SITE_OTHER): Payer: Federal, State, Local not specified - PPO | Admitting: Obstetrics and Gynecology

## 2021-12-15 VITALS — BP 120/67 | HR 83 | Wt 225.0 lb

## 2021-12-15 DIAGNOSIS — O24410 Gestational diabetes mellitus in pregnancy, diet controlled: Secondary | ICD-10-CM

## 2021-12-15 DIAGNOSIS — Z3A23 23 weeks gestation of pregnancy: Secondary | ICD-10-CM

## 2021-12-15 DIAGNOSIS — G932 Benign intracranial hypertension: Secondary | ICD-10-CM

## 2021-12-15 DIAGNOSIS — Z3492 Encounter for supervision of normal pregnancy, unspecified, second trimester: Secondary | ICD-10-CM

## 2021-12-15 NOTE — Progress Notes (Signed)
? ?  PRENATAL VISIT NOTE ? ?Subjective:  ?Lori Compton is a 31 y.o. G3P1011 at 39w1dbeing seen today for ongoing prenatal care.  She is currently monitored for the following issues for this high-risk pregnancy and has Idiopathic intracranial hypertension; Depression; Supervision of low-risk pregnancy, second trimester; Eczema; and Gestational diabetes mellitus (GDM), antepartum on their problem list. ? ?Patient doing well with no acute concerns today. She reports no complaints.  Contractions: Not present. Vag. Bleeding: None.  Movement: Present. Denies leaking of fluid.  ? ?The following portions of the patient's history were reviewed and updated as appropriate: allergies, current medications, past family history, past medical history, past social history, past surgical history and problem list. Problem list updated. ? ?Objective:  ? ?Vitals:  ? 12/15/21 1203  ?BP: 120/67  ?Pulse: 83  ?Weight: 225 lb (102.1 kg)  ? ? ?Fetal Status: Fetal Heart Rate (bpm): 145 Fundal Height: 23 cm Movement: Present    ? ?General:  Alert, oriented and cooperative. Patient is in no acute distress.  ?Skin: Skin is warm and dry. No rash noted.   ?Cardiovascular: Normal heart rate noted  ?Respiratory: Normal respiratory effort, no problems with respiration noted  ?Abdomen: Soft, gravid, appropriate for gestational age.  Pain/Pressure: Absent     ?Pelvic: Cervical exam deferred        ?Extremities: Normal range of motion.  Edema: None  ?Mental Status:  Normal mood and affect. Normal behavior. Normal judgment and thought content.  ? ?Assessment and Plan:  ?Pregnancy: G3P1011 at 223w1d ?1. [redacted] weeks gestation of pregnancy ? ? ?2. Diet controlled gestational diabetes mellitus (GDM), antepartum ?Blood sugars appear to be in range ? ?3. Idiopathic intracranial hypertension ?Pt not currently on meds ? ?4. Supervision of low-risk pregnancy, second trimester ?Continue routine prenatal care ? ?Preterm labor symptoms and general obstetric  precautions including but not limited to vaginal bleeding, contractions, leaking of fluid and fetal movement were reviewed in detail with the patient. ? ?Please refer to After Visit Summary for other counseling recommendations.  ? ?Return in about 3 weeks (around 01/05/2022) [facility] ? ? ?LaLynnda ShieldsMD ?Faculty Attending ?[facility]  ?

## 2021-12-24 DIAGNOSIS — O24410 Gestational diabetes mellitus in pregnancy, diet controlled: Secondary | ICD-10-CM | POA: Diagnosis not present

## 2021-12-24 DIAGNOSIS — O24419 Gestational diabetes mellitus in pregnancy, unspecified control: Secondary | ICD-10-CM | POA: Diagnosis not present

## 2021-12-24 DIAGNOSIS — O99212 Obesity complicating pregnancy, second trimester: Secondary | ICD-10-CM | POA: Diagnosis not present

## 2021-12-24 DIAGNOSIS — Z363 Encounter for antenatal screening for malformations: Secondary | ICD-10-CM | POA: Diagnosis not present

## 2022-01-06 ENCOUNTER — Ambulatory Visit (INDEPENDENT_AMBULATORY_CARE_PROVIDER_SITE_OTHER): Payer: Federal, State, Local not specified - PPO | Admitting: Family Medicine

## 2022-01-06 VITALS — BP 122/79 | HR 102 | Wt 228.5 lb

## 2022-01-06 DIAGNOSIS — G932 Benign intracranial hypertension: Secondary | ICD-10-CM

## 2022-01-06 DIAGNOSIS — O26899 Other specified pregnancy related conditions, unspecified trimester: Secondary | ICD-10-CM

## 2022-01-06 DIAGNOSIS — O24410 Gestational diabetes mellitus in pregnancy, diet controlled: Secondary | ICD-10-CM

## 2022-01-06 DIAGNOSIS — Z6791 Unspecified blood type, Rh negative: Secondary | ICD-10-CM

## 2022-01-06 DIAGNOSIS — Z3492 Encounter for supervision of normal pregnancy, unspecified, second trimester: Secondary | ICD-10-CM

## 2022-01-06 NOTE — Progress Notes (Signed)
? ?  PRENATAL VISIT NOTE ? ?Subjective:  ?Lori Compton is a 31 y.o. G3P1011 at 12w2dbeing seen today for ongoing prenatal care.  She is currently monitored for the following issues for this high-risk pregnancy and has Idiopathic intracranial hypertension; Depression; Supervision of low-risk pregnancy, second trimester; Eczema; Gestational diabetes mellitus (GDM), antepartum; and Rh negative state in antepartum period on their problem list. ? ?Patient reports no complaints.  Contractions: Irritability. Vag. Bleeding: None.  Movement: Present. Denies leaking of fluid.  ? ?The following portions of the patient's history were reviewed and updated as appropriate: allergies, current medications, past family history, past medical history, past social history, past surgical history and problem list.  ? ?Objective:  ? ?Vitals:  ? 01/06/22 1635  ?BP: 122/79  ?Pulse: (!) 102  ?Weight: 228 lb 8 oz (103.6 kg)  ? ? ?Fetal Status: Fetal Heart Rate (bpm): 154   Movement: Present    ? ?General:  Alert, oriented and cooperative. Patient is in no acute distress.  ?Skin: Skin is warm and dry. No rash noted.   ?Cardiovascular: Normal heart rate noted  ?Respiratory: Normal respiratory effort, no problems with respiration noted  ?Abdomen: Soft, gravid, appropriate for gestational age.  Pain/Pressure: Present     ?Pelvic: Cervical exam deferred        ?Extremities: Normal range of motion.  Edema: None  ?Mental Status: Normal mood and affect. Normal behavior. Normal judgment and thought content.  ? ?Assessment and Plan:  ?Pregnancy: G3P1011 at 228w2d1. Diet controlled gestational diabetes mellitus (GDM), antepartum ?Fasting 68-112 ( only 2 over 95), 2 hr 105-122 (only 1 over 120) ?Well controlled ?Encouraged her to continue good work ?TWG=8 lb 8 oz (3.856 kg) which is at goal for BMI 39 ? ?2. Supervision of low-risk pregnancy, second trimester ?Up to date ?[facility] ?Recommended child birth  education classes  ?Discussed doula to help with coping and AVS will have list  ?CBC/RPR/HIV put into future orders for next visit ? ?3. Idiopathic intracranial hypertension ?No HA/sx today ? ?4. Rh negative state in antepartum period ?Rhogam at 28-30 weeks ?Antibody screen put in as future order ? ?Preterm labor symptoms and general obstetric precautions including but not limited to vaginal bleeding, contractions, leaking of fluid and fetal movement were reviewed in detail with the patient. ?Please refer to After Visit Summary for other counseling recommendations.  ? ?Return in about 4 weeks (around 02/03/2022) for Routine prenatal care. ? ?Future Appointments  ?Date Time Provider [place]?01/11/2022  2:15 PM [facility] NURSE [facility]  ?01/11/2022  2:30 PM [facility] US2 [facility]?02/03/2022  1:15 PM PrDonnamae JudeMD [facility]? ? ?KiCaren MacadamMD ?

## 2022-01-06 NOTE — Patient Instructions (Signed)
DOULA LIST  ? ?[place]  [phone]  [email or web address]  ?[email or web address]  ?[place] [phone]  [email or web address]   ?[place] Reece Leader   [email or web address]   ???THE MOTHERLY DOULA?? Lajuana Ripple   [phone]   [email or web address] .[email or web address]   ?  ?The [facility]und[facility] Life Doula  Mattie Marlin  [phone]    [email or web address] .[email or web address] ?[email or web address]   ?[facility]  Angie Rosier     [phone]     [email or web address] .[email or web address]   ?Lenoria Farrier: [place] [phone]       [email or web address] .com  [email or web address]   ?[place] [phone]   [email or web address]   ?[address] [place], [place]  Tiffany Javier Glazier  [phone]  [email or web address] .[email or web address]   ?[email or web address]   ?[facility]   Burney Gauze   [phone]  [email or web address] .[email or web address] ?[email or web address]   ?Rondel Baton [place] Doula  Rondel Baton  [phone] [email or web address] .[email or web address] ?[email or web address]  ?Natural Baby Doulas  Lucilla Edin           ?Lottie Dawson         ?Verlee Rossetti       ?Zacarias Pontes Reynolds     [phone] [email or web address] .com  [email or web address]   ?[facility]   ?Aileen Fass [phone] Info'@[email or web address]'$ .[email or web address]   ?[place]  Abbie Sons     [phone]  [email or web address] .[email or web address] ?[email or web address]  ?[facility]     [phone]  [email or web address] .[email or web address]  ?Facebook and IG '@[email or web address] .co  ? Doroteo Glassman  [phone] [email or web address] .[email or web address]   ? Gus Rankin [phone] [email or web address] .[email or web address]  ? Delanna Ahmadi  [phone] [email or web address] .[email or web address]    ? [facility]  [phone] [email or web address]'$ .com   ?IG '@[email or web address]'$   ? Henrine Screws    [phone]  [email or web address] .com  ? Ellis Savage" Free  [phone] [email or web address] .[email or web address]   ? Mtende Roll  [phone] [email or web address]'@gmail'$ .com  ? Susie Williams   [email or web address] .[email or web address]   ? Crissie Reese    [phone] [email or web address] .com    ? Carlean Jews  [phone] [email or web address] .[email or web address]   ? Rigoberto Noel  [phone] [email or web address]'$ .com ?[email or web address]/   ? Shayla Rhem    [phone]   ?Baby on the [place]  [phone] [facility].doula'[email or web address] .com ?[email or web address]  ?Doula Mama [place] [phone] Katie'@[email or web address]'$ .com ?[email or web address]  ?Baby on the [place]  [phone] [facility].[email or web address]'@gmail'$ .com ?[email or web address]  ?[place] [phone]  [email or web address] .com  ?[email or web address]  ? Mellissa Kohut Harris-Jones  [phone] [email or web address]'$ .com  ? Renaee Munda [phone] [email or web address]'@[email or web address] .[email or web address]  ? Einar Grad [phone] [email or web address] .[email or web address]  ? Lance Muss  [phone] [email or web address]'$ .com  ?[employer]  Deri Fuelling  [phone] [email or web address]'@gmail'$ .com  ? Maxwell Caul [phone] [email or web address]'$ .com  ?MOOR Divine Myeka Dunn  [email or web address]'@gmail'$ .com  ? Threasa Alpha [phone] [email or web address]'$ .com  ? Freddie Apley [phone] info'@[email or web address]'$ .com  ? Juante Randleman [phone] [email or web address] .com  ?  ? ? ? ? ? ?Considering Waterbirth? ?Guide for patients at [facility] for [facility]) ?Why consider waterbirth? ?Gentle birth for babies  ?Less pain medicine used in labor  ?May allow for passive descent/less pushing  ?May reduce perineal tears  ?More mobility and instinctive maternal position changes  ?Increased maternal relaxation  ? ?Is waterbirth safe? What are the risks of infection, drowning or other complications? ?Infection:  ?Very low risk (3.7 % for tub vs 4.8% for bed)  ?7 in 21 waterbirths with documented infection  ?Poorly cleaned equipment most  common cause  ?Slightly lower group B strep transmission rate  ?Drowning  ?Maternal:  ?Very low risk  ?Related to seizures or fainting  ?  Newborn:  ?Very low risk. No evidence of increased risk of respiratory problems in multiple large studies  ?Physiological protection from breathing under water  ?Avoid underwater birth if there are any fetal complications  ?Once baby's head is out of the water, keep it out.  ?Birth complication  ?Some reports of cord trauma, but risk decreased by bringing baby to surface gradually  ?No evidence of increased risk of shoulder dystocia. Mothers can usually change positions faster in water than in a bed, possibly aiding the maneuvers to free the shoulder.  ? ?There are 2 things you MUST do to have a waterbirth with [facility]: ?Attend a waterbirth class at [place] at [facility]   ?3rd Wednesday of every month from 7-9 pm (virtual during [place]) ?Free ?Register online at [email or web address] or [email or web address] or by calling [phone] ?Bring [place] the certificate from the class to your prenatal appointment or send via MyChart ?Meet with a midwife at 36 weeks* to see if you can still plan a waterbirth and to sign the consent.  ? ?*We also recommend that you schedule as many of your prenatal visits with a midwife as possible.   ? ?Helpful information: ?You may want to bring a bathing suit top to the hospital to wear during labor but this is optional.  All other supplies are provided by the hospital. ?Please arrive at the hospital with signs of active labor, and do not wait at home until late in labor. It takes 45 min- 1 hour for fetal monitoring, and check in to your room to take place, plus transport and filling of the waterbirth tub.   ? ?Things that would prevent you from having a waterbirth: ?Premature, <37wks  ?Previous cesarean birth  ?Presence of thick meconium-stained fluid  ?Multiple gestation (Twins, triplets, etc.)  ?Uncontrolled diabetes or gestational  diabetes requiring medication  ?Hypertension diagnosed in pregnancy or preexisting hypertension (gestational hypertension, preeclampsia, or chronic hypertension) ?Fetal growth restriction (your baby measures less than 10th percentile on ultrasound) ?Heavy vaginal bleeding  ?Non-reassuring fetal heart rate  ?Active infection (MRSA, etc.). Group B Strep is NOT a contraindication for waterbirth.  ?If your labor has to be induced and induction method requires continuous monitoring of the baby's heart rate  ?Other risks/issues identified by your obstetrical provider  ? ?Please remember that birth is unpredictable. Under certain unforeseeable circumstances your provider may advise against giving birth in the tub. These decisions will be made on a case-by-case basis and with the safety of you and your baby as our highest priority. ? ? ? ?Updated 12/29/21 ? ?

## 2022-01-11 ENCOUNTER — Other Ambulatory Visit: Payer: Self-pay | Admitting: *Deleted

## 2022-01-11 ENCOUNTER — Ambulatory Visit: Payer: Federal, State, Local not specified - PPO | Admitting: *Deleted

## 2022-01-11 ENCOUNTER — Ambulatory Visit: Payer: Federal, State, Local not specified - PPO | Attending: Maternal & Fetal Medicine

## 2022-01-11 VITALS — BP 116/69 | HR 85

## 2022-01-11 DIAGNOSIS — O26899 Other specified pregnancy related conditions, unspecified trimester: Secondary | ICD-10-CM | POA: Insufficient documentation

## 2022-01-11 DIAGNOSIS — Z3492 Encounter for supervision of normal pregnancy, unspecified, second trimester: Secondary | ICD-10-CM

## 2022-01-11 DIAGNOSIS — G932 Benign intracranial hypertension: Secondary | ICD-10-CM | POA: Insufficient documentation

## 2022-01-11 DIAGNOSIS — O99212 Obesity complicating pregnancy, second trimester: Secondary | ICD-10-CM | POA: Diagnosis not present

## 2022-01-11 DIAGNOSIS — Z6791 Unspecified blood type, Rh negative: Secondary | ICD-10-CM | POA: Insufficient documentation

## 2022-01-11 DIAGNOSIS — Z3A27 27 weeks gestation of pregnancy: Secondary | ICD-10-CM | POA: Diagnosis not present

## 2022-01-11 DIAGNOSIS — O24410 Gestational diabetes mellitus in pregnancy, diet controlled: Secondary | ICD-10-CM | POA: Insufficient documentation

## 2022-01-11 DIAGNOSIS — O3412 Maternal care for benign tumor of corpus uteri, second trimester: Secondary | ICD-10-CM | POA: Diagnosis not present

## 2022-01-11 DIAGNOSIS — O24419 Gestational diabetes mellitus in pregnancy, unspecified control: Secondary | ICD-10-CM

## 2022-01-11 DIAGNOSIS — O99352 Diseases of the nervous system complicating pregnancy, second trimester: Secondary | ICD-10-CM | POA: Insufficient documentation

## 2022-01-11 DIAGNOSIS — Z362 Encounter for other antenatal screening follow-up: Secondary | ICD-10-CM | POA: Insufficient documentation

## 2022-01-11 DIAGNOSIS — D259 Leiomyoma of uterus, unspecified: Secondary | ICD-10-CM | POA: Diagnosis not present

## 2022-01-11 DIAGNOSIS — E669 Obesity, unspecified: Secondary | ICD-10-CM | POA: Diagnosis not present

## 2022-02-03 ENCOUNTER — Ambulatory Visit (INDEPENDENT_AMBULATORY_CARE_PROVIDER_SITE_OTHER): Payer: Federal, State, Local not specified - PPO | Admitting: Family Medicine

## 2022-02-03 VITALS — BP 129/83 | HR 90 | Wt 230.1 lb

## 2022-02-03 DIAGNOSIS — O26899 Other specified pregnancy related conditions, unspecified trimester: Secondary | ICD-10-CM

## 2022-02-03 DIAGNOSIS — Z6791 Unspecified blood type, Rh negative: Secondary | ICD-10-CM | POA: Diagnosis not present

## 2022-02-03 DIAGNOSIS — Z3492 Encounter for supervision of normal pregnancy, unspecified, second trimester: Secondary | ICD-10-CM | POA: Diagnosis not present

## 2022-02-03 DIAGNOSIS — O24410 Gestational diabetes mellitus in pregnancy, diet controlled: Secondary | ICD-10-CM

## 2022-02-03 MED ORDER — RHO D IMMUNE GLOBULIN 1500 UNIT/2ML IJ SOSY
300.0000 ug | PREFILLED_SYRINGE | Freq: Once | INTRAMUSCULAR | Status: AC
  Administered 2022-02-03: 300 ug via INTRAMUSCULAR

## 2022-02-03 NOTE — Progress Notes (Signed)
Pt did not bring Glucose logs today. ?

## 2022-02-03 NOTE — Patient Instructions (Addendum)
Following an appropriate diet and keeping your blood sugar under control is the most important thing to do for your health and that of your unborn baby. ? ?Please check your blood sugar 4 times daily. ? ?Please keep accurate BS logs and bring them with you to every visit.  Please bring your meter also. ? ?Goals for Blood sugar should be: ?1. Fasting (first thing in the morning before eating) should be less than 90.   ?2.  2 hours after meals should be less than 120. ? ?Please eat 3 meals and 3 snacks.  Include protein (meat, dairy-cheese, eggs, nuts) with all meals. ? ?Be mindful that carbohydrates increase your blood sugar.  Not just sweet food (cookies, cake, donuts, fruit, juice, soda) but also bread, pasta, rice, and potatoes.  You have to limit how many carbs you are eating. ? ?Adding exercise, as little as 30 minutes a day can decrease your blood sugar. ? ? ?Third Trimester of Pregnancy ? ?The third trimester of pregnancy is from week 28 through week 40. This is months 7 through 9. The third trimester is a time when the unborn baby (fetus) is growing rapidly. At the end of the ninth month, the fetus is about 20 inches long and weighs 6-10 pounds. ?Body changes during your third trimester ?During the third trimester, your body will continue to go through many changes. The changes vary and generally return to normal after your baby is born. ?Physical changes ?Your weight will continue to increase. You can expect to gain 25-35 pounds (11-16 kg) by the end of the pregnancy if you begin pregnancy at a normal weight. If you are underweight, you can expect to gain 28-40 lb (about 13-18 kg), and if you are overweight, you can expect to gain 15-25 lb (about 7-11 kg). ?You may begin to get stretch marks on your hips, abdomen, and breasts. ?Your breasts will continue to grow and may hurt. A yellow fluid (colostrum) may leak from your breasts. This is the first milk you are producing for your baby. ?You may have changes  in your hair. These can include thickening of your hair, rapid growth, and changes in texture. Some people also have hair loss during or after pregnancy, or hair that feels dry or thin. ?Your belly button may stick out. ?You may notice more swelling in your hands, face, or ankles. ?Health changes ?You may have heartburn. ?You may have constipation. ?You may develop hemorrhoids. ?You may develop swollen, bulging veins (varicose veins) in your legs. ?You may have increased body aches in the pelvis, back, or thighs. This is due to weight gain and increased hormones that are relaxing your joints. ?You may have increased tingling or numbness in your hands, arms, and legs. The skin on your abdomen may also feel numb. ?You may feel short of breath because of your expanding uterus. ?Other changes ?You may urinate more often because the fetus is moving lower into your pelvis and pressing on your bladder. ?You may have more problems sleeping. This may be caused by the size of your abdomen, an increased need to urinate, and an increase in your body's metabolism. ?You may notice the fetus "dropping," or moving lower in your abdomen (lightening). ?You may have increased vaginal discharge. ?You may notice that you have pain around your pelvic bone as your uterus distends. ?Follow these instructions at home: ?Medicines ?Follow your health care provider's instructions regarding medicine use. Specific medicines may be either safe or unsafe to take during  pregnancy. Do not take any medicines unless approved by your health care provider. ?Take a prenatal vitamin that contains at least 600 micrograms (mcg) of folic acid. ?Eating and drinking ?Eat a healthy diet that includes fresh fruits and vegetables, whole grains, good sources of protein such as meat, eggs, or tofu, and low-fat dairy products. ?Avoid raw meat and unpasteurized juice, milk, and cheese. These carry germs that can harm you and your baby. ?Eat 4 or 5 small meals rather  than 3 large meals a day. ?You may need to take these actions to prevent or treat constipation: ?Drink enough fluid to keep your urine pale yellow. ?Eat foods that are high in fiber, such as beans, whole grains, and fresh fruits and vegetables. ?Limit foods that are high in fat and processed sugars, such as fried or sweet foods. ?Activity ?Exercise only as directed by your health care provider. Most people can continue their usual exercise routine during pregnancy. Try to exercise for 30 minutes at least 5 days a week. Stop exercising if you experience contractions in the uterus. ?Stop exercising if you develop pain or cramping in the lower abdomen or lower back. ?Avoid heavy lifting. ?Do not exercise if it is very hot or humid or if you are at a high altitude. ?If you choose to, you may continue to have sex unless your health care provider tells you not to. ?Relieving pain and discomfort ?Take frequent breaks and rest with your legs raised (elevated) if you have leg cramps or low back pain. ?Take warm sitz baths to soothe any pain or discomfort caused by hemorrhoids. Use hemorrhoid cream if your health care provider approves. ?Wear a supportive bra to prevent discomfort from breast tenderness. ?If you develop varicose veins: ?Wear support hose as told by your health care provider. ?Elevate your feet for 15 minutes, 3-4 times a day. ?Limit salt in your diet. ?Safety ?Talk to your health care provider before traveling far distances. ?Do not use hot tubs, steam rooms, or saunas. ?Wear your seat belt at all times when driving or riding in a car. ?Talk with your health care provider if someone is verbally or physically abusive to you. ?Preparing for birth ?To prepare for the arrival of your baby: ?Take prenatal classes to understand, practice, and ask questions about labor and delivery. ?Visit the hospital and tour the maternity area. ?Purchase a rear-facing car seat and make sure you know how to install it in your  car. ?Prepare the baby's room or sleeping area. Make sure to remove all pillows and stuffed animals from the baby's crib to prevent suffocation. ?General instructions ?Avoid cat litter boxes and soil used by cats. These carry germs that can cause birth defects in the baby. If you have a cat, ask someone to clean the litter box for you. ?Do not douche or use tampons. Do not use scented sanitary pads. ?Do not use any products that contain nicotine or tobacco, such as cigarettes, e-cigarettes, and chewing tobacco. If you need help quitting, ask your health care provider. ?Do not use any herbal remedies, illegal drugs, or medicines that were not prescribed to you. Chemicals in these products can harm your baby. ?Do not drink alcohol. ?You will have more frequent prenatal exams during the third trimester. During a routine prenatal visit, your health care provider will do a physical exam, perform tests, and discuss your overall health. Keep all follow-up visits. This is important. ?Where to find more information ?American Pregnancy Association: americanpregnancy.org ?[employer]  Obstetricians and Gynecologists: PoolDevices.com.pt ?Office on Women's Health: KeywordPortfolios.com.br ?Contact a health care provider if you have: ?A fever. ?Mild pelvic cramps, pelvic pressure, or nagging pain in your abdominal area or lower back. ?Vomiting or diarrhea. ?Bad-smelling vaginal discharge or foul-smelling urine. ?Pain when you urinate. ?A headache that does not go away when you take medicine. ?Visual changes or see spots in front of your eyes. ?Get help right away if: ?Your water breaks. ?You have regular contractions less than 5 minutes apart. ?You have spotting or bleeding from your vagina. ?You have severe abdominal pain. ?You have difficulty breathing. ?You have chest pain. ?You have fainting spells. ?You have not felt your baby move for the time period told by your health care provider. ?You have  new or increased pain, swelling, or redness in an arm or leg. ?Summary ?The third trimester of pregnancy is from week 28 through week 40 (months 7 through 9). ?You may have more problems sleeping. This can

## 2022-02-03 NOTE — Progress Notes (Signed)
? ?  PRENATAL VISIT NOTE ? ?Subjective:  ?Lori Compton is a 31 y.o. G3P1011 at 7w2dbeing seen today for ongoing prenatal care.  She is currently monitored for the following issues for this high-risk pregnancy and has Idiopathic intracranial hypertension; Depression; Supervision of low-risk pregnancy, second trimester; Eczema; Gestational diabetes mellitus (GDM), antepartum; and Rh negative state in antepartum period on their problem list. ? ?Patient reports no complaints.  Contractions: Irritability. Vag. Bleeding: None.  Movement: Present. Denies leaking of fluid.  ? ?The following portions of the patient's history were reviewed and updated as appropriate: allergies, current medications, past family history, past medical history, past social history, past surgical history and problem list.  ? ?Objective:  ? ?Vitals:  ? 02/03/22 1330  ?BP: 129/83  ?Pulse: 90  ?Weight: 230 lb 1.6 oz (104.4 kg)  ? ? ?Fetal Status: Fetal Heart Rate (bpm): 142   Movement: Present    ? ?General:  Alert, oriented and cooperative. Patient is in no acute distress.  ?Skin: Skin is warm and dry. No rash noted.   ?Cardiovascular: Normal heart rate noted  ?Respiratory: Normal respiratory effort, no problems with respiration noted  ?Abdomen: Soft, gravid, appropriate for gestational age.  Pain/Pressure: Absent     ?Pelvic: Cervical exam deferred        ?Extremities: Normal range of motion.  Edema: Trace  ?Mental Status: Normal mood and affect. Normal behavior. Normal judgment and thought content.  ? ?Assessment and Plan:  ?Pregnancy: G3P1011 at 320w2d1. Rh negative state in antepartum period ?S/p Rhogam today with labs ?- rho (d) immune globulin (RHIG/RHOPHYLAC) injection 300 mcg ?- Antibody screen ? ?2. Diet controlled gestational diabetes mellitus (GDM), antepartum ?FBS 97 ?2 hour pp 111-115, 118 ish ?Continue diet--no book today per pt. Report, CBGs recalled ? ?3. Supervision of low-risk pregnancy, second trimester ?- CBC ?- Antibody  screen ?- HIV Antibody (routine testing w rflx) ?- RPR ? ?Preterm labor symptoms and general obstetric precautions including but not limited to vaginal bleeding, contractions, leaking of fluid and fetal movement were reviewed in detail with the patient. ?Please refer to After Visit Summary for other counseling recommendations.  ? ?[facility]17[facility]2022[facility] ?Future Appointments  ?Date Time Provider [place]?02/14/2022  2:30 PM [facility] NURSE [facility]  ?02/14/2022  2:45 PM [facility] US5 [facility]  ?02/24/2022 10:55 AM ErChancy MilroyMD [facility]? ? ?TaDonnamae JudeMD ? ?

## 2022-02-04 LAB — CBC
Hematocrit: 36.4 % (ref 34.0–46.6)
Hemoglobin: 12.4 g/dL (ref 11.1–15.9)
MCH: 28.3 pg (ref 26.6–33.0)
MCHC: 34.1 g/dL (ref 31.5–35.7)
MCV: 83 fL (ref 79–97)
Platelets: 200 10*3/uL (ref 150–450)
RBC: 4.38 x10E6/uL (ref 3.77–5.28)
RDW: 13.1 % (ref 11.7–15.4)
WBC: 8 10*3/uL (ref 3.4–10.8)

## 2022-02-04 LAB — RPR: RPR Ser Ql: NONREACTIVE

## 2022-02-04 LAB — HIV ANTIBODY (ROUTINE TESTING W REFLEX): HIV Screen 4th Generation wRfx: NONREACTIVE

## 2022-02-04 LAB — ANTIBODY SCREEN: Antibody Screen: NEGATIVE

## 2022-02-11 ENCOUNTER — Inpatient Hospital Stay (HOSPITAL_COMMUNITY)
Admission: AD | Admit: 2022-02-11 | Discharge: 2022-02-11 | Disposition: A | Discharge status: Home / Self Care | Payer: Federal, State, Local not specified - PPO | Attending: Obstetrics & Gynecology | Admitting: Obstetrics & Gynecology

## 2022-02-11 ENCOUNTER — Encounter (HOSPITAL_COMMUNITY): Payer: Self-pay | Admitting: Obstetrics & Gynecology

## 2022-02-11 DIAGNOSIS — R42 Dizziness and giddiness: Secondary | ICD-10-CM | POA: Insufficient documentation

## 2022-02-11 DIAGNOSIS — Z3A31 31 weeks gestation of pregnancy: Secondary | ICD-10-CM | POA: Diagnosis not present

## 2022-02-11 DIAGNOSIS — O26893 Other specified pregnancy related conditions, third trimester: Secondary | ICD-10-CM | POA: Diagnosis not present

## 2022-02-11 DIAGNOSIS — O24410 Gestational diabetes mellitus in pregnancy, diet controlled: Secondary | ICD-10-CM | POA: Diagnosis not present

## 2022-02-11 LAB — CBC WITH DIFFERENTIAL/PLATELET
Abs Immature Granulocytes: 0.05 10*3/uL (ref 0.00–0.07)
Basophils Absolute: 0 10*3/uL (ref 0.0–0.1)
Basophils Relative: 0 %
Eosinophils Absolute: 0.2 10*3/uL (ref 0.0–0.5)
Eosinophils Relative: 2 %
HCT: 36.8 % (ref 36.0–46.0)
Hemoglobin: 12.3 g/dL (ref 12.0–15.0)
Immature Granulocytes: 1 %
Lymphocytes Relative: 24 %
Lymphs Abs: 2.1 10*3/uL (ref 0.7–4.0)
MCH: 28.2 pg (ref 26.0–34.0)
MCHC: 33.4 g/dL (ref 30.0–36.0)
MCV: 84.4 fL (ref 80.0–100.0)
Monocytes Absolute: 0.8 10*3/uL (ref 0.1–1.0)
Monocytes Relative: 9 %
Neutro Abs: 5.7 10*3/uL (ref 1.7–7.7)
Neutrophils Relative %: 64 %
Platelets: 179 10*3/uL (ref 150–400)
RBC: 4.36 MIL/uL (ref 3.87–5.11)
RDW: 13.9 % (ref 11.5–15.5)
WBC: 8.9 10*3/uL (ref 4.0–10.5)
nRBC: 0 % (ref 0.0–0.2)

## 2022-02-11 LAB — URINALYSIS, ROUTINE W REFLEX MICROSCOPIC
Bilirubin Urine: NEGATIVE
Glucose, UA: NEGATIVE mg/dL
Hgb urine dipstick: NEGATIVE
Ketones, ur: NEGATIVE mg/dL
Leukocytes,Ua: NEGATIVE
Nitrite: NEGATIVE
Protein, ur: NEGATIVE mg/dL
Specific Gravity, Urine: 1.005 (ref 1.005–1.030)
pH: 7 (ref 5.0–8.0)

## 2022-02-11 LAB — COMPREHENSIVE METABOLIC PANEL
ALT: 14 U/L (ref 0–44)
AST: 16 U/L (ref 15–41)
Albumin: 3 g/dL — ABNORMAL LOW (ref 3.5–5.0)
Alkaline Phosphatase: 68 U/L (ref 38–126)
Anion gap: 8 (ref 5–15)
BUN: 6 mg/dL (ref 6–20)
CO2: 21 mmol/L — ABNORMAL LOW (ref 22–32)
Calcium: 8.8 mg/dL — ABNORMAL LOW (ref 8.9–10.3)
Chloride: 105 mmol/L (ref 98–111)
Creatinine, Ser: 0.74 mg/dL (ref 0.44–1.00)
GFR, Estimated: >60 mL/min (ref >60)
Glucose, Bld: 73 mg/dL (ref 70–99)
Potassium: 4 mmol/L (ref 3.5–5.1)
Sodium: 134 mmol/L — ABNORMAL LOW (ref 135–145)
Total Bilirubin: 0.4 mg/dL (ref 0.3–1.2)
Total Protein: 6.6 g/dL (ref 6.5–8.1)

## 2022-02-11 LAB — TSH: TSH: 0.701 u[IU]/mL (ref 0.350–4.500)

## 2022-02-11 LAB — GLUCOSE, CAPILLARY: Glucose-Capillary: 83 mg/dL (ref 70–99)

## 2022-02-11 NOTE — MAU Note (Signed)
...  Lori Compton is a 31 y.o. at 98w3dhere in MAU reporting: Around 1100 she began to feel extremely fatigued, dizzy, and hot. She reports she got up to turn the over head fan on and she felt the room was spinning so she sat back down. She reports she last ate around 0845 and last drank water around 1215. She is also endorsing intermittent right lower back pain that feels sharp and shooting since 1100. Denies VB or LOF. +FM.  POCT CBG in triage 83 83 mg/dL.  Fasting CBG at 0809 was 87. She reports at 1042 it was 738   Patient ate natural oats and a banana for breakfast.  Pain score:  3/10 right lower back  FHT: 142 initial external Lab orders placed from triage:  POCT CBG, UA

## 2022-02-11 NOTE — MAU Provider Note (Signed)
History     CSN: [id number]  Arrival date and time: 02/11/22 1228   Event Date/Time   First Provider Initiated Contact with Patient 02/11/22 1347      Chief Complaint  Patient presents with   Dizziness   Back Pain   Hot Flashes   HPI  [name] is a 30 y.o. female G3P1011 with a history of GDM, diet controlled and idiopathic intracranial hypertension, here with dizziness, hot flashes, nausea, and mouth watering. These are all new symptoms. She has not had a syncopal episode. She has no chest pain. She has no other associated symptoms.   She ate breakfast around 0845 which included Oats, banana, and water. This is atypical for her to eat a meal without protein. This is the only meal she has had today.Reports fasting BS in the 80's today, and PP BS in 74 this morning. She denies pain, LOF, bleeding. + FM  OB History     Gravida  3   Para  1   Term  1   Preterm  0   AB  1   Living  1      SAB  0   IAB  1   Ectopic  0   Multiple  0   Live Births  1           Past Medical History:  Diagnosis Date   Depression    Fibroid    Gestational diabetes    Idiopathic intracranial hypertension    Migraine     Past Surgical History:  Procedure Laterality Date   OOPHORECTOMY Right 2019   WISDOM TOOTH EXTRACTION      Family History  Problem Relation Age of Onset   Thyroid disease Mother    Breast cancer Maternal Grandmother    Hypertension Maternal Grandmother    Heart Problems Maternal Aunt     Social History   Tobacco Use   Smoking status: Never   Smokeless tobacco: Never  Vaping Use   Vaping Use: Never used  Substance Use Topics   Alcohol use: Not Currently    Alcohol/week: 2.0 standard drinks    Types: 1 Glasses of wine, 1 Cans of beer per week    Comment: either beer or wine, 1/week   Drug use: Never    Allergies: No Known Allergies  Medications Prior to Admission  Medication Sig Dispense Refill Last Dose   Prenatal Vit-Fe  Fumarate-FA (PRENATAL VITAMIN) 27-0.8 MG TABS Take 1 tablet by mouth daily. 30 tablet 3 02/11/2022   Accu-Chek Softclix Lancets lancets Use four times daily as instructed. 100 each 12    Blood Glucose Monitoring Suppl (ACCU-CHEK GUIDE) w/Device KIT 1 Device by Does not apply route in the morning, at noon, in the evening, and at bedtime. 1 kit 0    glucose blood test strip Use as instructed 100 each 12    Results for orders placed or performed during the hospital encounter of 02/11/22 (from the past 48 hour(s))  Glucose, capillary     Status: None   Collection Time: 02/11/22 12:50 PM  Result Value Ref Range   Glucose-Capillary 83 70 - 99 mg/dL    Comment: Glucose reference range applies only to samples taken after fasting for at least 8 hours.  Urinalysis, Routine w reflex microscopic Urine, Clean Catch     Status: Abnormal   Collection Time: 02/11/22 12:52 PM  Result Value Ref Range   Color, Urine STRAW (A) YELLOW   APPearance CLEAR CLEAR     Specific Gravity, Urine 1.005 1.005 - 1.030   pH 7.0 5.0 - 8.0   Glucose, UA NEGATIVE NEGATIVE mg/dL   Hgb urine dipstick NEGATIVE NEGATIVE   Bilirubin Urine NEGATIVE NEGATIVE   Ketones, ur NEGATIVE NEGATIVE mg/dL   Protein, ur NEGATIVE NEGATIVE mg/dL   Nitrite NEGATIVE NEGATIVE   Leukocytes,Ua NEGATIVE NEGATIVE    Comment: Performed at [facility], [address] [place], [place] [address]  CBC with Differential/Platelet     Status: None   Collection Time: 02/11/22  2:00 PM  Result Value Ref Range   WBC 8.9 4.0 - 10.5 K/uL   RBC 4.36 3.87 - 5.11 MIL/uL   Hemoglobin 12.3 12.0 - 15.0 g/dL   HCT 36.8 36.0 - 46.0 %   MCV 84.4 80.0 - 100.0 fL   MCH 28.2 26.0 - 34.0 pg   MCHC 33.4 30.0 - 36.0 g/dL   RDW 13.9 11.5 - 15.5 %   Platelets 179 150 - 400 K/uL   nRBC 0.0 0.0 - 0.2 %   Neutrophils Relative % 64 %   Neutro Abs 5.7 1.7 - 7.7 K/uL   Lymphocytes Relative 24 %   Lymphs Abs 2.1 0.7 - 4.0 K/uL   Monocytes Relative 9 %   Monocytes  Absolute 0.8 0.1 - 1.0 K/uL   Eosinophils Relative 2 %   Eosinophils Absolute 0.2 0.0 - 0.5 K/uL   Basophils Relative 0 %   Basophils Absolute 0.0 0.0 - 0.1 K/uL   Immature Granulocytes 1 %   Abs Immature Granulocytes 0.05 0.00 - 0.07 K/uL    Comment: Performed at [facility], [address] [place], [place] [address]  Comprehensive metabolic panel     Status: Abnormal   Collection Time: 02/11/22  2:00 PM  Result Value Ref Range   Sodium 134 (L) 135 - 145 mmol/L   Potassium 4.0 3.5 - 5.1 mmol/L   Chloride 105 98 - 111 mmol/L   CO2 21 (L) 22 - 32 mmol/L   Glucose, Bld 73 70 - 99 mg/dL    Comment: Glucose reference range applies only to samples taken after fasting for at least 8 hours.   BUN 6 6 - 20 mg/dL   Creatinine, Ser 0.74 0.44 - 1.00 mg/dL   Calcium 8.8 (L) 8.9 - 10.3 mg/dL   Total Protein 6.6 6.5 - 8.1 g/dL   Albumin 3.0 (L) 3.5 - 5.0 g/dL   AST 16 15 - 41 U/L   ALT 14 0 - 44 U/L   Alkaline Phosphatase 68 38 - 126 U/L   Total Bilirubin 0.4 0.3 - 1.2 mg/dL   GFR, Estimated >60 >60 mL/min    Comment: (NOTE) Calculated using the CKD-EPI Creatinine Equation (2021)    Anion gap 8 5 - 15    Comment: Performed at [facility], [address] [place], [place] [address]  TSH     Status: None   Collection Time: 02/11/22  2:00 PM  Result Value Ref Range   TSH 0.701 0.350 - 4.500 uIU/mL    Comment: Performed by a 3rd Generation assay with a functional sensitivity of <=0.01 uIU/mL. Performed at [facility], [address] [place], [place] [address]    Review of Systems  Gastrointestinal:  Positive for nausea. Negative for vomiting.  Endocrine: Positive for heat intolerance. Negative for polydipsia and polyuria.  Neurological:  Positive for dizziness.  Physical Exam   Blood pressure 124/69, pulse 82, temperature 98.6 F (  37 C), temperature source Oral, resp. rate 17, height 5' 4" (1.626 m), weight 105.2 kg, last menstrual period 07/06/2021, SpO2 (!) 86  %.  Physical Exam Constitutional:      General: She is not in acute distress.    Appearance: Normal appearance. She is not ill-appearing, toxic-appearing or diaphoretic.  Pulmonary:     Effort: Pulmonary effort is normal.  Skin:    General: Skin is warm.     Capillary Refill: Capillary refill takes more than 3 seconds.  Neurological:     Mental Status: She is alert and oriented to person, place, and time.  Psychiatric:        Behavior: Behavior normal.   Fetal Tracing: Baseline: 130 bpm Variability: Moderate  Accelerations: 15x15 Decelerations: None Toco: UI  MAU Course  Procedures None  MDM  Orthostatic vitals normal  CMP, CBC and TSH reviewed.  Patient ate a protein lunch while in MAU   Assessment and Plan   A:  1. Dizziness   2. Diet controlled gestational diabetes mellitus (GDM) in third trimester   3. [redacted] weeks gestation of pregnancy      P:  DC home Patient felt better after lunch If symptoms return or persist recommend referral to [facility] cards- patient aware and agreeable Reviewed protein rich meals with an increase in oral fluid intake Return to MAU if symptoms worsen  Tkai Serfass, Artist Pais, NP 02/12/2022 8:11 PM

## 2022-02-14 ENCOUNTER — Encounter: Payer: Self-pay | Admitting: *Deleted

## 2022-02-14 ENCOUNTER — Ambulatory Visit: Payer: Federal, State, Local not specified - PPO | Admitting: *Deleted

## 2022-02-14 ENCOUNTER — Ambulatory Visit: Payer: Federal, State, Local not specified - PPO | Attending: Obstetrics

## 2022-02-14 VITALS — BP 122/61 | HR 75

## 2022-02-14 DIAGNOSIS — G932 Benign intracranial hypertension: Secondary | ICD-10-CM

## 2022-02-14 DIAGNOSIS — O24419 Gestational diabetes mellitus in pregnancy, unspecified control: Secondary | ICD-10-CM | POA: Insufficient documentation

## 2022-02-14 DIAGNOSIS — Z3A31 31 weeks gestation of pregnancy: Secondary | ICD-10-CM | POA: Diagnosis not present

## 2022-02-14 DIAGNOSIS — O99353 Diseases of the nervous system complicating pregnancy, third trimester: Secondary | ICD-10-CM | POA: Diagnosis not present

## 2022-02-14 DIAGNOSIS — Z6791 Unspecified blood type, Rh negative: Secondary | ICD-10-CM | POA: Insufficient documentation

## 2022-02-14 DIAGNOSIS — O24410 Gestational diabetes mellitus in pregnancy, diet controlled: Secondary | ICD-10-CM | POA: Diagnosis not present

## 2022-02-14 DIAGNOSIS — O99213 Obesity complicating pregnancy, third trimester: Secondary | ICD-10-CM | POA: Diagnosis not present

## 2022-02-14 DIAGNOSIS — O99212 Obesity complicating pregnancy, second trimester: Secondary | ICD-10-CM

## 2022-02-14 DIAGNOSIS — Z3492 Encounter for supervision of normal pregnancy, unspecified, second trimester: Secondary | ICD-10-CM | POA: Insufficient documentation

## 2022-02-14 DIAGNOSIS — E669 Obesity, unspecified: Secondary | ICD-10-CM

## 2022-02-14 DIAGNOSIS — D259 Leiomyoma of uterus, unspecified: Secondary | ICD-10-CM | POA: Insufficient documentation

## 2022-02-14 DIAGNOSIS — O26899 Other specified pregnancy related conditions, unspecified trimester: Secondary | ICD-10-CM | POA: Insufficient documentation

## 2022-02-15 ENCOUNTER — Other Ambulatory Visit: Payer: Self-pay | Admitting: *Deleted

## 2022-02-15 DIAGNOSIS — Z6839 Body mass index (BMI) 39.0-39.9, adult: Secondary | ICD-10-CM

## 2022-02-15 DIAGNOSIS — G932 Benign intracranial hypertension: Secondary | ICD-10-CM

## 2022-02-15 DIAGNOSIS — O24410 Gestational diabetes mellitus in pregnancy, diet controlled: Secondary | ICD-10-CM

## 2022-02-24 ENCOUNTER — Encounter: Payer: Self-pay | Admitting: Obstetrics and Gynecology

## 2022-02-24 ENCOUNTER — Ambulatory Visit (INDEPENDENT_AMBULATORY_CARE_PROVIDER_SITE_OTHER): Payer: Federal, State, Local not specified - PPO | Admitting: Obstetrics and Gynecology

## 2022-02-24 VITALS — BP 116/72 | HR 85 | Wt 230.2 lb

## 2022-02-24 DIAGNOSIS — O24410 Gestational diabetes mellitus in pregnancy, diet controlled: Secondary | ICD-10-CM

## 2022-02-24 DIAGNOSIS — Z6791 Unspecified blood type, Rh negative: Secondary | ICD-10-CM

## 2022-02-24 DIAGNOSIS — Z3492 Encounter for supervision of normal pregnancy, unspecified, second trimester: Secondary | ICD-10-CM

## 2022-02-24 DIAGNOSIS — O26899 Other specified pregnancy related conditions, unspecified trimester: Secondary | ICD-10-CM

## 2022-02-24 NOTE — Progress Notes (Signed)
Subjective:  Lori Compton is a 31 y.o. G3P1011 at 72w2dbeing seen today for ongoing prenatal care.  She is currently monitored for the following issues for this high-risk pregnancy and has Idiopathic intracranial hypertension; Depression; Supervision of low-risk pregnancy, second trimester; Eczema; Gestational diabetes mellitus (GDM), antepartum; and Rh negative state in antepartum period on their problem list.  Patient reports general discomforts of pregnancy.  Contractions: Not present. Vag. Bleeding: None.  Movement: Present. Denies leaking of fluid.   The following portions of the patient's history were reviewed and updated as appropriate: allergies, current medications, past family history, past medical history, past social history, past surgical history and problem list. Problem list updated.  Objective:   Vitals:   02/24/22 1113  BP: 116/72  Pulse: 85  Weight: 230 lb 3.2 oz (104.4 kg)    Fetal Status: Fetal Heart Rate (bpm): 138   Movement: Present     General:  Alert, oriented and cooperative. Patient is in no acute distress.  Skin: Skin is warm and dry. No rash noted.   Cardiovascular: Normal heart rate noted  Respiratory: Normal respiratory effort, no problems with respiration noted  Abdomen: Soft, gravid, appropriate for gestational age. Pain/Pressure: Present     Pelvic:  Cervical exam deferred        Extremities: Normal range of motion.  Edema: None  Mental Status: Normal mood and affect. Normal behavior. Normal judgment and thought content.   Urinalysis:      Assessment and Plan:  Pregnancy: G3P1011 at 372w2d1. Supervision of low-risk pregnancy, second trimester Stable  2. Diet controlled gestational diabetes mellitus (GDM), antepartum CBG's in goal range Continue with diet Growth scan later this week  3. Rh negative state in antepartum period S/P Rhogam  Preterm labor symptoms and general obstetric precautions including but not limited to vaginal  bleeding, contractions, leaking of fluid and fetal movement were reviewed in detail with the patient. Please refer to After Visit Summary for other counseling recommendations.  Return in about 2 weeks (around 03/10/2022) for OB visit, face to face, MD only.   ErChancy MilroyMD

## 2022-02-24 NOTE — Progress Notes (Signed)
Pt is concerned  due to GDM that she is not going to have another [place] until 5 weeks.

## 2022-02-24 NOTE — Patient Instructions (Signed)

## 2022-03-01 ENCOUNTER — Encounter: Payer: Self-pay | Admitting: *Deleted

## 2022-03-06 ENCOUNTER — Other Ambulatory Visit: Payer: Self-pay

## 2022-03-06 ENCOUNTER — Inpatient Hospital Stay (HOSPITAL_COMMUNITY)
Admission: AD | Admit: 2022-03-06 | Discharge: 2022-03-07 | Disposition: A | Discharge status: Home / Self Care | Payer: Federal, State, Local not specified - PPO | Attending: Obstetrics & Gynecology | Admitting: Obstetrics & Gynecology

## 2022-03-06 DIAGNOSIS — R109 Unspecified abdominal pain: Secondary | ICD-10-CM | POA: Insufficient documentation

## 2022-03-06 DIAGNOSIS — Z3689 Encounter for other specified antenatal screening: Secondary | ICD-10-CM

## 2022-03-06 DIAGNOSIS — O26893 Other specified pregnancy related conditions, third trimester: Secondary | ICD-10-CM | POA: Insufficient documentation

## 2022-03-06 DIAGNOSIS — O4703 False labor before 37 completed weeks of gestation, third trimester: Secondary | ICD-10-CM

## 2022-03-06 DIAGNOSIS — Z3A34 34 weeks gestation of pregnancy: Secondary | ICD-10-CM | POA: Insufficient documentation

## 2022-03-06 DIAGNOSIS — Z6791 Unspecified blood type, Rh negative: Secondary | ICD-10-CM

## 2022-03-06 DIAGNOSIS — Z3492 Encounter for supervision of normal pregnancy, unspecified, second trimester: Secondary | ICD-10-CM

## 2022-03-06 DIAGNOSIS — O26899 Other specified pregnancy related conditions, unspecified trimester: Secondary | ICD-10-CM

## 2022-03-06 NOTE — MAU Note (Incomplete)
.  Lori Compton is a 31 y.o. at 77w5dhere in MAU reporting: pain and cramping /sharp shooting pain from her front to her back only on right side since earlier this afternoon. Good fetal movement felt and denies any vag bleeding or leaking. Stated she felt like she had to have BM but only gas came out. Reports she had some diarrhea yesterday.   Onset of complaint: 1pm Pain score: 7/10 Vitals:   03/07/22 0002  BP: 131/75  Pulse: 94  Resp: 18  Temp: 98.2 F (36.8 C)     FHT:131 Lab orders placed from triage:

## 2022-03-06 NOTE — MAU Note (Signed)
.  Lori Compton is a 31 y.o. at 90w5dhere in MAU reporting: pain and cramping /sharp shooting pain from her front to her back only on right side since earlier this afternoon. Good fetal movement felt and denies any vag bleeding or leaking. Stated she felt like she had to have BM but only gas came out. Reports she had some diarrhea yesterday.   Onset of complaint: 1pm Pain score: 7/10 Vitals:   03/07/22 0002  BP: 131/75  Pulse: 94  Resp: 18  Temp: 98.2 F (36.8 C)     FHT:131 Lab orders placed from triage:  u/a

## 2022-03-07 ENCOUNTER — Encounter (HOSPITAL_COMMUNITY): Payer: Self-pay | Admitting: Obstetrics & Gynecology

## 2022-03-07 DIAGNOSIS — Z3A34 34 weeks gestation of pregnancy: Secondary | ICD-10-CM | POA: Diagnosis not present

## 2022-03-07 DIAGNOSIS — O26893 Other specified pregnancy related conditions, third trimester: Secondary | ICD-10-CM | POA: Diagnosis not present

## 2022-03-07 DIAGNOSIS — R109 Unspecified abdominal pain: Secondary | ICD-10-CM | POA: Diagnosis not present

## 2022-03-07 DIAGNOSIS — O4703 False labor before 37 completed weeks of gestation, third trimester: Secondary | ICD-10-CM | POA: Diagnosis not present

## 2022-03-07 LAB — WET PREP, GENITAL
Clue Cells Wet Prep HPF POC: NONE SEEN
Sperm: NONE SEEN
Trich, Wet Prep: NONE SEEN
WBC, Wet Prep HPF POC: >=10 — AB (ref <10)
Yeast Wet Prep HPF POC: NONE SEEN

## 2022-03-07 LAB — GC/CHLAMYDIA PROBE AMP (~~LOC~~) NOT AT ARMC
Chlamydia: NEGATIVE
Comment: NEGATIVE
Comment: NORMAL
Neisseria Gonorrhea: NEGATIVE

## 2022-03-07 LAB — URINALYSIS, ROUTINE W REFLEX MICROSCOPIC
Bilirubin Urine: NEGATIVE
Glucose, UA: NEGATIVE mg/dL
Hgb urine dipstick: NEGATIVE
Ketones, ur: NEGATIVE mg/dL
Leukocytes,Ua: NEGATIVE
Nitrite: NEGATIVE
Protein, ur: NEGATIVE mg/dL
Specific Gravity, Urine: 1.005 (ref 1.005–1.030)
pH: 7 (ref 5.0–8.0)

## 2022-03-07 MED ORDER — ONDANSETRON 4 MG PO TBDP
8.0000 mg | ORAL_TABLET | Freq: Once | ORAL | Status: DC
Start: 2022-03-07 — End: 2022-03-07

## 2022-03-07 MED ORDER — CYCLOBENZAPRINE HCL 5 MG PO TABS
10.0000 mg | ORAL_TABLET | Freq: Once | ORAL | Status: AC
  Administered 2022-03-07: 10 mg via ORAL
  Filled 2022-03-07: qty 2

## 2022-03-07 MED ORDER — ONDANSETRON HCL 4 MG/2ML IJ SOLN
4.0000 mg | Freq: Once | INTRAMUSCULAR | Status: AC
  Administered 2022-03-07: 4 mg via INTRAVENOUS
  Filled 2022-03-07: qty 2

## 2022-03-07 MED ORDER — LACTATED RINGERS IV BOLUS
1000.0000 mL | Freq: Once | INTRAVENOUS | Status: AC
  Administered 2022-03-07: 1000 mL via INTRAVENOUS

## 2022-03-07 MED ORDER — ACETAMINOPHEN 325 MG PO TABS
650.0000 mg | ORAL_TABLET | Freq: Once | ORAL | Status: AC
  Administered 2022-03-07: 650 mg via ORAL
  Filled 2022-03-07: qty 2

## 2022-03-07 NOTE — MAU Provider Note (Signed)
History     CSN: [id number]  Arrival date and time: 03/06/22 2347   Event Date/Time   First Provider Initiated Contact with Patient 03/07/22 0025      Chief Complaint  Patient presents with   Abdominal Pain   Lori Compton is a 31 y.o. G3P1011 at 54w6dwho [facility]  Patient reports her next apps is scheduled for tomorrow as a virtual visit.  She presents today for Abdominal Pain.  She states she has been having cramping on her right side since 1pm that is constant.  She reports it has no relieving or aggravating factors. She rates the pain a 7/10.   She also reports sharp pain that starts in the lower abdomen and radiates around to her back. This pain is intermittent and has no relieving factors, but is aggravated by fetal movement.  She reports at 10pm she started having pressure that feels like she needs to have a bowel movement, but is only been gas.   Last at at 2040-CBG not checked after.    OB History     Gravida  3   Para  1   Term  1   Preterm  0   AB  1   Living  1      SAB  0   IAB  1   Ectopic  0   Multiple  0   Live Births  1           Past Medical History:  Diagnosis Date   Depression    Fibroid    Gestational diabetes    Idiopathic intracranial hypertension    Migraine     Past Surgical History:  Procedure Laterality Date   OOPHORECTOMY Right 2019   WISDOM TOOTH EXTRACTION      Family History  Problem Relation Age of Onset   Thyroid disease Mother    Breast cancer Maternal Grandmother    Hypertension Maternal Grandmother    Heart Problems Maternal Aunt     Social History   Tobacco Use   Smoking status: Never   Smokeless tobacco: Never  Vaping Use   Vaping Use: Never used  Substance Use Topics   Alcohol use: Not Currently    Alcohol/week: 2.0 standard drinks of alcohol    Types: 1 Glasses of wine, 1 Cans of beer per week    Comment: either beer or wine, 1/week   Drug use: Never    Allergies: No  Known Allergies  Medications Prior to Admission  Medication Sig Dispense Refill Last Dose   Prenatal Vit-Fe Fumarate-FA (PRENATAL VITAMIN) 27-0.8 MG TABS Take 1 tablet by mouth daily. 30 tablet 3 03/06/2022   Accu-Chek Softclix Lancets lancets Use four times daily as instructed. 100 each 12    Blood Glucose Monitoring Suppl (ACCU-CHEK GUIDE) w/Device KIT 1 Device by Does not apply route in the morning, at noon, in the evening, and at bedtime. 1 kit 0    glucose blood test strip Use as instructed 100 each 12     Review of Systems  Gastrointestinal:  Positive for abdominal pain, diarrhea (Loose stool) and nausea. Negative for constipation and vomiting.  Genitourinary:  Negative for difficulty urinating, dysuria, vaginal bleeding and vaginal discharge.  Musculoskeletal:  Positive for back pain.  Neurological:  Negative for dizziness, light-headedness and headaches.   Physical Exam   Blood pressure 130/79, pulse 88, temperature 98.2 F (36.8 C), resp. rate 18, height 5' 4" (1.626 m), weight 107 kg, last  menstrual period 07/06/2021, SpO2 97 %.  Physical Exam Vitals reviewed. Exam conducted with a chaperone present.  Constitutional:      General: She is not in acute distress.    Appearance: She is well-developed.  HENT:     Head: Normocephalic and atraumatic.  Eyes:     Conjunctiva/sclera: Conjunctivae normal.  Cardiovascular:     Rate and Rhythm: Normal rate.  Pulmonary:     Effort: Pulmonary effort is normal.  Abdominal:     Palpations: Abdomen is soft.     Tenderness: There is no abdominal tenderness.     Comments: Soft, Gravid, Appears AGA  Genitourinary:    Comments: Wet Prep, GC/CT collected by blind swab  Dilation: Closed Effacement (%): 50 Cervical Position: Posterior Station: -2 Exam by:: Gavin Pound, CNM No discharge or blood noted on exam glove. Musculoskeletal:        General: Normal range of motion.     Right lower leg: No edema.     Left lower leg: No edema.   Skin:    General: Skin is warm and dry.  Neurological:     Mental Status: She is oriented to person, place, and time.  Psychiatric:        Mood and Affect: Mood normal.        Behavior: Behavior normal.    Fetal Assessment 145 bpm, Mod Var, -Decels, +15x15 Accels Toco: Q38mn  MAU Course   Results for orders placed or performed during the hospital encounter of 03/06/22 (from the past 24 hour(s))  Urinalysis, Routine w reflex microscopic Urine, Clean Catch     Status: Abnormal   Collection Time: 03/07/22 12:15 AM  Result Value Ref Range   Color, Urine STRAW (A) YELLOW   APPearance CLEAR CLEAR   Specific Gravity, Urine 1.005 1.005 - 1.030   pH 7.0 5.0 - 8.0   Glucose, UA NEGATIVE NEGATIVE mg/dL   Hgb urine dipstick NEGATIVE NEGATIVE   Bilirubin Urine NEGATIVE NEGATIVE   Ketones, ur NEGATIVE NEGATIVE mg/dL   Protein, ur NEGATIVE NEGATIVE mg/dL   Nitrite NEGATIVE NEGATIVE   Leukocytes,Ua NEGATIVE NEGATIVE  Wet prep, genital     Status: Abnormal   Collection Time: 03/07/22 12:24 AM  Result Value Ref Range   Yeast Wet Prep HPF POC NONE SEEN NONE SEEN   Trich, Wet Prep NONE SEEN NONE SEEN   Clue Cells Wet Prep HPF POC NONE SEEN NONE SEEN   WBC, Wet Prep HPF POC >=10 (A) <10   Sperm NONE SEEN    No results found.  MDM PE UA, Wet Prep, GC/CT  EFM Start IV LR Bolus Pain medication Antiemetic Consult Assessment and Plan  31year old G3P1011  SIUP at 34.6 weeks Cat I FT Preterm Contractions vs Back Pain  -POC Reviewed -Exam performed and findings discussed. -Reassured that cervix is closed. -Start IV and give LR bolus -Zofran 425mIV for nausea. -Give tylenol and flexeril once nausea subsides. -NST reactive  JeMaryann ConnersSN, CNM 03/07/2022, 12:25 AM   Reassessment (2:00 AM) -Patient reports relief of nausea. -States pain now 4/10. -Will apply heat to area and reassess.  -FHT remains reactive.  Reassessment (2:23 AM) -Recheck with some cervical  change. -Patient informed of findings.  Discussed that provider with minimal concern for cervical change in general, but will discuss with MD regarding additional steps.  -J.Ozan consulted and informed of patient status, evaluation, interventions, and results. Advised: *Monitor for additional 2 hours and reassess cervix. *No need  for procardia. *Fluids okay if desired.  -Patient updated on POC, agreeable and without questions.   Reassessment (4:19 AM) -NST remains reactive. -Cervical exam remains unchanged. -Precautions given.  -Instructed to keep next appt as scheduled. -Encouraged to call primary office or return to MAU if symptoms worsen or with the onset of new symptoms. -Discharged to home in stable condition.  Maryann Conners MSN, CNM Advanced Practice Provider, Center for [employer] [facility]

## 2022-03-09 ENCOUNTER — Telehealth (INDEPENDENT_AMBULATORY_CARE_PROVIDER_SITE_OTHER): Payer: Federal, State, Local not specified - PPO | Admitting: Obstetrics and Gynecology

## 2022-03-09 VITALS — BP 134/79 | HR 90

## 2022-03-09 DIAGNOSIS — Z6791 Unspecified blood type, Rh negative: Secondary | ICD-10-CM

## 2022-03-09 DIAGNOSIS — G932 Benign intracranial hypertension: Secondary | ICD-10-CM

## 2022-03-09 DIAGNOSIS — O26893 Other specified pregnancy related conditions, third trimester: Secondary | ICD-10-CM

## 2022-03-09 DIAGNOSIS — Z3492 Encounter for supervision of normal pregnancy, unspecified, second trimester: Secondary | ICD-10-CM

## 2022-03-09 DIAGNOSIS — O24410 Gestational diabetes mellitus in pregnancy, diet controlled: Secondary | ICD-10-CM

## 2022-03-09 DIAGNOSIS — Z3A35 35 weeks gestation of pregnancy: Secondary | ICD-10-CM

## 2022-03-09 DIAGNOSIS — Z3483 Encounter for supervision of other normal pregnancy, third trimester: Secondary | ICD-10-CM

## 2022-03-09 NOTE — Progress Notes (Signed)
OBSTETRICS PRENATAL VIRTUAL VISIT ENCOUNTER NOTE  Provider location: [facility]   Patient location: Home  I connected with Lori Compton on 03/09/22 at  3:15 PM EDT by MyChart Video Encounter and verified that I am speaking with the correct person using two identifiers. I discussed the limitations, risks, security and privacy concerns of performing an evaluation and management service virtually and the availability of in person appointments. I also discussed with the patient that there may be a patient responsible charge related to this service. The patient expressed understanding and agreed to proceed. Subjective:  Lori Compton is a 31 y.o. G3P1011 at 28w1dbeing seen today for ongoing prenatal care.  She is currently monitored for the following issues for this high-risk pregnancy and has Idiopathic intracranial hypertension; Depression; Supervision of low-risk pregnancy, second trimester; Eczema; Gestational diabetes mellitus (GDM), antepartum; and Rh negative state in antepartum period on their problem list.  Patient reports occasional contractions.  Contractions: Not present. Vag. Bleeding: None.  Movement: Present. Denies any leaking of fluid.   The following portions of the patient's history were reviewed and updated as appropriate: allergies, current medications, past family history, past medical history, past social history, past surgical history and problem list.   Objective:   Vitals:   03/09/22 1447  BP: 134/79  Pulse: 90    Fetal Status:     Movement: Present     General:  Alert, oriented and cooperative. Patient is in no acute distress.  Respiratory: Normal respiratory effort, no problems with respiration noted  Mental Status: Normal mood and affect. Normal behavior. Normal judgment and thought content.  Rest of physical exam deferred due to type of encounter  Imaging: UK[place]FM OB FOLLOW UP  Result Date:  02/14/2022 ----------------------------------------------------------------------  OBSTETRICS REPORT                       (Signed Final 02/14/2022 03:49 pm) ---------------------------------------------------------------------- Patient Info  ID #:       [id number]                         D.O.B.:  [birth year](30 yrs)  Name:       Lori Compton             Visit Date: 02/14/2022 03:21 pm ---------------------------------------------------------------------- Performed By  Attending:        VJohnell ComingsMD         Ref. Address:     [facility]                                                             [place] Performed By:     [place]         Location:         [facility]                                     [facility] at                                                             [place] [facility]                                                             [facility]  Referred By:      [name] CNM ---------------------------------------------------------------------- Orders  #  Description                           Code        Ordered By  1  [place] MFM OB FOLLOW UP                   [phone]    YU FANG ----------------------------------------------------------------------  #  Order #                     Accession #                Episode #  1  [id number]                   [id number]                 [id number] ---------------------------------------------------------------------- Indications  Gestational diabetes in pregnancy, diet        O24.410  controlled  Obesity complicating pregnancy, third          O99.213  trimester (BMI 37)  Medical complication of pregnancy (IIH)        O26.90  [redacted] weeks gestation of pregnancy                Z3A.31  LR NIPS - Female, Negative Horizon, Negative  AFP ---------------------------------------------------------------------- Fetal Evaluation  Num Of Fetuses:          1  Fetal Heart Rate(bpm):  138  Cardiac Activity:       Observed  Presentation:           Cephalic  Placenta:               Anterior  P. Cord Insertion:      Previously Visualized  Amniotic Fluid  AFI FV:      Within normal limits  AFI Sum(cm)     %Tile       Largest Pocket(cm)  11              23          4.6  RUQ(cm)       RLQ(cm)       LUQ(cm)        LLQ(cm)  2.6  2.2           1.6            4.6 ---------------------------------------------------------------------- Biometry  BPD:      84.4  mm     G. Age:  34w 0d         92  %    CI:        79.88   %    70 - 86                                                          FL/HC:      20.6   %    19.1 - 21.3  HC:      298.4  mm     G. Age:  33w 0d         49  %    HC/AC:      1.08        0.96 - 1.17  AC:      275.3  mm     G. Age:  31w 4d         40  %    FL/BPD:     73.0   %    71 - 87  FL:       61.6  mm     G. Age:  32w 0d         39  %    FL/AC:      22.4   %    20 - 24  Est. FW:    1892  gm      4 lb 3 oz     45  % ---------------------------------------------------------------------- OB History  Gravidity:    3         Term:   1        Prem:   0        SAB:   0  TOP:          1       Ectopic:  0        Living: 1 ---------------------------------------------------------------------- Gestational Age  LMP:           31w 6d        Date:  07/06/21                 EDD:   04/12/22  U/S Today:     32w 5d                                        EDD:   04/06/22  Best:          31w 6d     Det. By:  LMP  (07/06/21)          EDD:   04/12/22 ---------------------------------------------------------------------- Anatomy  Cranium:               Appears normal         Aortic Arch:            Previously seen  Cavum:                 Appears normal  Ductal Arch:            Previously seen  Ventricles:            Previously seen        Diaphragm:              Previously seen  Choroid Plexus:        Previously seen        Stomach:                Appears normal,  left                                                                        sided  Cerebellum:            Previously seen        Abdomen:                Appears normal  Posterior Fossa:       Previously seen        Abdominal Wall:         Previously seen  Nuchal Fold:           Previously seen        Cord Vessels:           Previously seen  Face:                  Orbits and profile     Kidneys:                Appear normal                         previously seen  Lips:                  Previously seen        Bladder:                Appears normal  Thoracic:              Appears normal         Spine:                  Previously seen  Heart:                 Previously seen        Upper Extremities:      Previously seen  RVOT:                  Previously seen        Lower Extremities:      Previously seen  LVOT:                  Previously seen  Other:  Fetus appears to be a female. Rt hand 5th, Lt hand open/5th. Heels,          feet, Lenses, nasal bone, 3VV prev visualized. Technically difficult          due to maternal habitus and fetal position. ---------------------------------------------------------------------- Cervix Uterus Adnexa  Adnexa  No abnormality visualized. ---------------------------------------------------------------------- Comments  This patient was seen for a follow up growth scan due to  diet  controlled gestational diabetes and maternal obesity.  She  denies any problems since her last exam.  She was informed that the fetal growth and amniotic fluid  level appears appropriate for her gestational age.  Due to gestational diabetes, delivery should probably occur at  around 55 weeks.  A follow-up growth scan was scheduled in 5 weeks to  estimate the fetal weight closer to delivery.  Weekly fetal testing should be started at 32 weeks should  she require insulin or metformin for treatment of gestational  diabetes. ----------------------------------------------------------------------                    Johnell Comings, MD Electronically Signed Final Report   02/14/2022 03:49 pm ----------------------------------------------------------------------   Assessment and Plan:  Pregnancy: [id number] at 70w1d1. Supervision of low-risk pregnancy, second trimester Continue routine prenatal care  2. Rh negative state in antepartum period Treat after delivery  3. [redacted] weeks gestation of pregnancy   4. Diet controlled gestational diabetes mellitus (GDM), antepartum FBS: 82-88 PPBS: 97-111  Pt notes compliance with diet  5. Idiopathic intracranial hypertension No issues currently  Preterm labor symptoms and general obstetric precautions including but not limited to vaginal bleeding, contractions, leaking of fluid and fetal movement were reviewed in detail with the patient. I discussed the assessment and treatment plan with the patient. The patient was provided an opportunity to ask questions and all were answered. The patient agreed with the plan and demonstrated an understanding of the instructions. The patient was advised to call back or seek an in-person office evaluation/[facility] any [facility] or concerning symptoms. Please refer to After Visit Summary for other counseling recommendations.   I provided 10 minutes of face-to-face time during this encounter.  Return in about 1 week (around 03/16/2022) [facility] weeks [facility].  Future Appointments  Date Time Provider [place] 03/09/2022  3:15 PM BGriffin Basil MD [facility] 03/17/2022  1:55 PM DRenard Matter MD [facility] 03/21/2022 10:45 AM [facility] NURSE [facility] 03/21/2022 11:00 AM [facility] US1 [facility] 03/24/2022  3:55 PM NCaren Macadam MD [facility] 03/31/2022  2:15 PM DRenard Matter MD [facility] 04/07/2022  2:15 PM NCaren Macadam MD [facility] 04/14/2022 10:55 AM DRenard Matter MD [facility]   LGriffin Basil MD [facility] for [facility]

## 2022-03-09 NOTE — Progress Notes (Signed)
I connected with  Lori Compton on 03/09/22 at  3:15 PM EDT by MyChart Virtual Video Visit and verified that I am speaking with the correct person using two identifiers.   I discussed the limitations, risks, security and privacy concerns of performing an evaluation and management service by telephone and the availability of in person appointments. I also discussed with the patient that there may be a patient responsible charge related to this service. The patient expressed understanding and agreed to proceed.  [name], CMA 03/09/2022  2:43 PM

## 2022-03-17 ENCOUNTER — Encounter: Payer: Self-pay | Admitting: Family Medicine

## 2022-03-17 ENCOUNTER — Other Ambulatory Visit: Payer: Self-pay

## 2022-03-17 ENCOUNTER — Ambulatory Visit (INDEPENDENT_AMBULATORY_CARE_PROVIDER_SITE_OTHER): Payer: Federal, State, Local not specified - PPO | Admitting: Family Medicine

## 2022-03-17 VITALS — BP 132/71 | HR 91 | Wt 238.2 lb

## 2022-03-17 DIAGNOSIS — Z3492 Encounter for supervision of normal pregnancy, unspecified, second trimester: Secondary | ICD-10-CM

## 2022-03-17 DIAGNOSIS — Z3A36 36 weeks gestation of pregnancy: Secondary | ICD-10-CM

## 2022-03-17 DIAGNOSIS — O24410 Gestational diabetes mellitus in pregnancy, diet controlled: Secondary | ICD-10-CM

## 2022-03-18 ENCOUNTER — Encounter (HOSPITAL_COMMUNITY): Payer: Self-pay

## 2022-03-18 ENCOUNTER — Telehealth (HOSPITAL_COMMUNITY): Payer: Self-pay | Admitting: *Deleted

## 2022-03-18 NOTE — Telephone Encounter (Signed)
Preadmission screen  

## 2022-03-20 LAB — CULTURE, BETA STREP (GROUP B ONLY): Strep Gp B Culture: NEGATIVE

## 2022-03-21 ENCOUNTER — Ambulatory Visit: Payer: Federal, State, Local not specified - PPO | Attending: Obstetrics

## 2022-03-21 ENCOUNTER — Encounter (HOSPITAL_COMMUNITY): Payer: Self-pay | Admitting: *Deleted

## 2022-03-21 ENCOUNTER — Telehealth (HOSPITAL_COMMUNITY): Payer: Self-pay | Admitting: *Deleted

## 2022-03-21 ENCOUNTER — Encounter: Payer: Self-pay | Admitting: *Deleted

## 2022-03-21 ENCOUNTER — Ambulatory Visit: Payer: Federal, State, Local not specified - PPO | Admitting: *Deleted

## 2022-03-21 VITALS — BP 128/72 | HR 88

## 2022-03-21 DIAGNOSIS — Z3A36 36 weeks gestation of pregnancy: Secondary | ICD-10-CM

## 2022-03-21 DIAGNOSIS — O24410 Gestational diabetes mellitus in pregnancy, diet controlled: Secondary | ICD-10-CM

## 2022-03-21 DIAGNOSIS — Z6839 Body mass index (BMI) 39.0-39.9, adult: Secondary | ICD-10-CM | POA: Diagnosis not present

## 2022-03-21 DIAGNOSIS — O99350 Diseases of the nervous system complicating pregnancy, unspecified trimester: Secondary | ICD-10-CM | POA: Diagnosis not present

## 2022-03-21 DIAGNOSIS — G932 Benign intracranial hypertension: Secondary | ICD-10-CM | POA: Insufficient documentation

## 2022-03-21 DIAGNOSIS — E669 Obesity, unspecified: Secondary | ICD-10-CM

## 2022-03-21 DIAGNOSIS — O99213 Obesity complicating pregnancy, third trimester: Secondary | ICD-10-CM | POA: Diagnosis not present

## 2022-03-21 NOTE — Telephone Encounter (Signed)
Preadmission screen  

## 2022-03-24 ENCOUNTER — Other Ambulatory Visit: Payer: Self-pay

## 2022-03-24 ENCOUNTER — Ambulatory Visit (INDEPENDENT_AMBULATORY_CARE_PROVIDER_SITE_OTHER): Payer: Federal, State, Local not specified - PPO | Admitting: Family Medicine

## 2022-03-24 VITALS — BP 135/77 | HR 94 | Wt 236.9 lb

## 2022-03-24 DIAGNOSIS — Z6791 Unspecified blood type, Rh negative: Secondary | ICD-10-CM

## 2022-03-24 DIAGNOSIS — O0990 Supervision of high risk pregnancy, unspecified, unspecified trimester: Secondary | ICD-10-CM

## 2022-03-24 DIAGNOSIS — O24410 Gestational diabetes mellitus in pregnancy, diet controlled: Secondary | ICD-10-CM

## 2022-03-24 DIAGNOSIS — O26899 Other specified pregnancy related conditions, unspecified trimester: Secondary | ICD-10-CM

## 2022-03-24 NOTE — Progress Notes (Signed)
   PRENATAL VISIT NOTE  Subjective:  Lori Compton is a 31 y.o. G3P1011 at 90w2dbeing seen today for ongoing prenatal care.  She is currently monitored for the following issues for this high-risk pregnancy and has Idiopathic intracranial hypertension; Depression; Supervision of high risk pregnancy, antepartum; Eczema; Gestational diabetes mellitus (GDM), antepartum; and Rh negative state in antepartum period on their problem list.  Patient reports no complaints.  Contractions: Irritability. Vag. Bleeding: None.  Movement: Present. Denies leaking of fluid.   The following portions of the patient's history were reviewed and updated as appropriate: allergies, current medications, past family history, past medical history, past social history, past surgical history and problem list.   Objective:   Vitals:   03/24/22 1635  BP: 135/77  Pulse: 94  Weight: 236 lb 14.4 oz (107.5 kg)    Fetal Status: Fetal Heart Rate (bpm): 154 Fundal Height: 38 cm Movement: Present  Presentation: Vertex  General:  Alert, oriented and cooperative. Patient is in no acute distress.  Skin: Skin is warm and dry. No rash noted.   Cardiovascular: Normal heart rate noted  Respiratory: Normal respiratory effort, no problems with respiration noted  Abdomen: Soft, gravid, appropriate for gestational age.  Pain/Pressure: Present     Pelvic: Cervical exam performed in the presence of a chaperone Dilation: 1.5 Effacement (%): Thick Station: -3  Extremities: Normal range of motion.  Edema: None  Mental Status: Normal mood and affect. Normal behavior. Normal judgment and thought content.   Assessment and Plan:  Pregnancy: G3P1011 at 3105w2d. Rh negative state in antepartum period Received rhogam  2. Diet controlled gestational diabetes mellitus (GDM), antepartum BG log reviewed today Fastings 70-80s, 2hr ppt < 120 for all reviewed Has discussion with last provider about IOL at 39 weeks -- she is NOT on medication  currently for glucose control and she desires IOL at 39 weeks  3. Supervision of high risk pregnancy, antepartum - Pt is interested in waterbirth.  No contraindications at this time per chart review/patient assessment.   - Pt to enroll in class, see CNMs for most visits in the office.  - Discussed waterbirth as option for low-risk pregnancy.  Reviewed conditions that may arise during pregnancy that will risk pt out of waterbirth including hypertension, uncontrolled diabetes, fetal growth restriction <10%ile, etc. Patient took class and will send Mychart of her WB certificate  Term labor symptoms and general obstetric precautions including but not limited to vaginal bleeding, contractions, leaking of fluid and fetal movement were reviewed in detail with the patient. Please refer to After Visit Summary for other counseling recommendations.   Return in about 1 week (around 03/31/2022) for Routine prenatal care.  Future Appointments  Date Time Provider [place]7/02/2022  2:15 PM DaRenard MatterMD WMW[facility]MPheL[facility]7/07/2022  7:00 AM MC-LD SC[place]C-INDC None  04/07/2022  2:15 PM NeCaren MacadamMD WMLincoln HospitalMSelect [facility]7/20/2023 10:55 AM DaRenard MatterMD WMSaint [facility] HospitalMC[facility]  KiCaren MacadamMD

## 2022-03-24 NOTE — Patient Instructions (Signed)
AREA PEDIATRIC/FAMILY PRACTICE PHYSICIANS  Central/Southeast [place] ([address]) [facility] Chambliss, MD; Eniola, MD; Hale, MD; Hensel, MD; McDiarmid, MD; McIntyer, MD; Neal, MD; Walden, MD [address] [place], [place] [address] [phone] Mon-Fri 8:30-12:30, 1:30-5:00 Providers come to see babies at [facility] Accepting Medicaid [facility] providers who accept newborns: Koirala, MD; Morrow, MD; Wolters, MD [address], [place], [place] [address] [phone] Mon-Fri 8:00-5:30 Babies seen by providers at [facility] Does NOT accept Medicaid Please call early in hospitalization for appointment (limited availability)  [facility] Mulberry, MD [address] [place], [place] [address] [phone] Mon, Tue, Thur, Fri 8:30-5:00, Wed 10:00-7:00 (closed 1-2pm) Babies seen by [facility] providers Accepting Medicaid Rubin - Pediatrician Rubin, MD [address], [place], [place] [address] [phone] Mon-Fri 8:30-5:00, Sat 8:30-12:00 Provider comes to see babies at [facility] Accepting Medicaid Must have been referred from current patients or contacted office prior to delivery [facility] ([facility]) Brown, MD; Chandler, MD; Ettefagh, MD; Grant, MD; Lester, MD; McCormick, MD; McQueen, MD; Prose, MD; Simha, MD; Stanley, MD; Stryffeler, NP; Tebben, NP [address], [place], [place] [address] [phone] Mon, Tue, Thur, Fri 8:30-5:30, Wed 9:30-5:30, Sat 8:30-12:30 Babies seen by [facility] providers Accepting Medicaid Only accepting infants of first-time parents or siblings of current patients Hospital discharge coordinator will make follow-up appointment Jack Amos [address], [place], [place]  [address] [phone]   Fax - [phone] [facility] [address], [place], [place]  [address] Phone - [phone]   Fax - [phone] Shilpa Gosrani [address], [place], [place]  [address] [phone]  [place]heast [place] ([address]) [facility] Bates, MD; Brassfield, MD; Cooper, Cox, MD; MD; Davis, MD; Dovico, MD; Ettefaugh, MD; Little, MD; Lowe, MD; Keiffer, MD; Melvin, MD; Sumner, MD; Williams, MD [address], [place], [place] [address] [phone] Mon-Fri 8:30-5:00 (extended evenings Mon-Thur as needed), Sat-Sun 10:00-1:00 Providers come to see babies at [facility] Accepting Medicaid for families of first-time babies and families with all children in the household age 3 and under. Must register with office prior to making appointment (M-F only). [facility] Henson, NP; Knapp, MD; Lalonde, MD; Tysinger, PA [address] [place], [place] [address] [phone] Mon-Fri 8:00-5:00 Babies seen by providers at [facility] Does NOT accept Medicaid/Commercial Insurance Only [facility] ([facility])  Artis, MD; Barnes, MD; Bratton, MD; Coccaro, MD; Lockett Gardner, MD; Kramer, MD; Marshall, MD; Netherton, MD; Poleto, MD; Skinner, MD [address] [place], [place] [address] [phone] Mon-Fri 8:30-5:30, Sat (Oct.-Mar.) 9:00-1:00 Babies seen by providers at [facility] Accepting Medicaid  [place] ([address]) [facility] Reid, MD; Warner, MD [address], [place], [place] [address] [phone] Mon-Fri 8:30-5:00, Sat 8:30-12:00 Providers come to see babies at [facility] Does NOT accept Medicaid [facility] Becker, PA; Hagler, MD; Scifres, PA; Sun, MD; Swayne, MD [address], [place], [place] [address] [phone] Mon-Fri 8:00-5:00 Babies seen by providers at [facility] Does NOT accept Medicaid Only accepting babies of parents who  are patients Please call early in hospitalization for appointment (limited availability)  Pediatricians Clark, MD; Frye, MD; Kelleher, MD; Mack, NP; Miller, MD; O'Keller, MD; Patterson, NP; Pudlo, MD; Puzio, MD; Thomas, MD; Tucker, MD; Twiselton, MD [address], [place], [place] [address] [phone] Mon-Fri 8:00-5:00, Sat 9:00-12:00 Providers come to see babies at [facility] Does NOT accept Medicaid  [place] ([address]) [facility] providers accepting new patients: Brake, NP; Wharton, PA [address], [place], [place] [address] [phone] Mon-Fri 8:00-5:00 Babies seen by providers at [facility] Does NOT accept Medicaid Only accepting babies of parents who are patients Please call early in hospitalization for appointment (limited availability) [facility] Gay, MD; Quinlan, MD [address] [place], [place] [address] [phone] (press [phone] to schedule appointment) Mon-Fri 8:00-5:00 Providers come to see babies at [facility] Does NOT accept Medicaid [facility] Mazer, MD [address] [place], [place] [address] [phone] Mon-Fri 8:30-5:00 (lunch 12:30-1:00), extended hours by appointment only Wed 5:00-6:30 Babies seen by [facility] providers Accepting Medicaid [facility] Banks, MD; Jordan, MD; Koberlein, MD [address], [place], [place] [address] [phone] Mon-Fri 8:00-5:00 Babies seen by [facility] providers Does NOT accept Medicaid [facility] Parker, MD; Hunter, MD; Wallace, DO [address] [place], [place] [address] [phone] Mon-Fri 8:00-5:00 Babies seen by [facility] providers Does NOT accept Medicaid [facility] Brandon, PA; Brecken, PA; Christy, NP; Dees, MD; DeClaire, MD; DeWeese, MD; Hansen, NP; Mills, NP; Parrish, NP; Smoot, NP; Summer, MD; Vapne,  MD [address] [place], [place] [address] [phone] Mon-Fri 8:30-5:00, Sat 10:00-1:00 Providers come to see babies at [facility] Does NOT accept Medicaid Free prenatal information session Tuesdays at 4:45pm [facility] Bouska, MD; Gordon, PA; Jeffery, PA; Weber, PA [address] [place] [address] [phone] Mon-Fri 7:30-5:30 Babies seen by [facility] providers [facility] [address], [place], [place]  [address] [phone]   Fax - [phone]  [place] ([address] & [address]) [facility] [place] [address] [place], [place] [address] [phone] Mon-Thur 8:00-6:00 Providers come to see babies at [facility] Accepting Medicaid [facility] Anderson, NP; Badger, MD; Beal, PA; Spencer, PA [address] [place], [place] [address] [phone] Mon-Thur 7:30-7:30, Fri 7:30-4:30 Babies seen by [facility] providers Accepting Medicaid [facility] Agbuya, MD; Klett, NP; Romgoolam, MD [address], [place], [place] [address] [phone] Mon-Fri 8:30-5:00, Sat 8:30-12:00 Providers come to see babies at [facility] Accepting Medicaid Must have "Meet & Greet" appointment at office prior to delivery [facility] ([facility] McCord, MD; Wallace, MD; Wood, MD [address], [place], [place] [address] [phone] Mon-Wed 8:00-6:00, Thur-Fri 8:00-5:00, Sat 9:00-12:00 Providers come to see babies at [facility] Does NOT accept Medicaid Only accepting siblings of current patients [facility]  [address], [place], [place]  [address] [phone]   Fax - [phone] [facility] [address], [place], [place]  [address] [phone]   Fax -  [phone]  [place] ([address] & [address]) [facility] Cirigliano, DO; Matthews, DO [address] [place], [place] [address] [phone] Mon-Fri 7:00-5:00 Babies seen by [facility] providers Does NOT accept Medicaid [facility] Briscoe, MD; Howley, PA; Moreira, PA [address], [place], [place] [address] [phone] Mon-Fri 8:00-5:00 Babies seen by [facility] providers Accepting Medicaid [facility] Boyd, MD; Church, PA; Jones, NP; Osborn, PA [address], , [place] [address] [phone] Mon-Fri 8:00-5:00 Babies seen by providers at [facility] Accepting Medicaid  [place] ([address]) [facility] [place] Wendling, DO [address] [place], [place] [address] [phone] Mon-Fri 8:00-5:00 Babies seen by [facility] providers Does NOT accept Medicaid Limited availability, please call early in hospitalization to schedule follow-up [facility] Calderon, PA; Cummings, MD; Dillard, MD; Martin, PA; Olson, MD; VanDeven, PA [address], [place], [place] [address] [phone] Mon-Fri 8:30-5:00, Sat 9:00-12:00 Babies seen by providers at [facility] Accepting Medicaid Please register online then schedule online or call office [email or web address] Hunter, NP; Kumar, MD; Martin Rogers, PA [address], [place], [place] [address] [phone] Mon-Fri 8:00-5:00 Babies seen by providers at [facility] Accepting Medicaid [facility] ([facility]) Millbrook, MD; Kristi Fleenor, NP; West, MD [address], [place], [place] [address] [phone] Mon-Fri 8:00-5:30, Sat&Sun by appointment (phones open at  8:30) Babies seen by [facility] providers Accepting Medicaid Must be a first-time baby or sibling of current patient [facility] [place]  [address], [place], [place]  [address] [phone]   Fax - [phone]  [place] ([address]) [facility] Brown, PA; Cowen, PA; Rice, MD; Helton, PA; Spry, MD [address] [place], [place] [address] [phone] Mon-Thur 8:00-7:00, Fri 8:00-5:00, Sat 8:00-12:00, Sun 9:00-12:00 Babies seen by [facility] providers Accepting Medicaid [facility] Coe-Goins, MD; Marshall, MD; Pierre-Louis, MD [address], [place], [place] [address] [phone] Mon-Thur 8:00-5:00 Babies seen by providers at [facility] Accepting Medicaid [facility] Bratton, MD; Coe-Goins, MD; Hayes, MD; Lewis, MD; List, MD; Lott, MD; Marshall, MD; Moran, MD; O'Neal, MD; Pierre-Louis, MD; Pitonzo, MD; Scholer, MD; Spangle, MD [address] [place], [place] [address] [phone] Mon-Fri 8:00-5:30, Sat (Oct.-Mar.) 9:00-1:00 Babies seen by providers at [facility] Accepting Medicaid Must fill out new patient packet, available online at [email or web address] [facility] ([facility]) Friddle, NP; Harris, NP; Kelly, NP; Logan, MD; Melvin, PA; Poth, MD; Ramadoss, MD; Stanton, NP [address], [place], [place] [address] [phone] Mon-Thur 8:00-5:30, Fri 8:00-5:00 Babies seen by providers at [facility] Accepting Medicaid  [place] ([address]) [facility] Dixon, PA; Tomales, MD; Pickard, MD; Tapia, PA [address], [place], [place] [address] [phone] Mon-Fri 8:00-5:00 Babies seen by providers at [facility] Accepting Medicaid   [place] ([address]) [facility] [place] Masneri, DO; Meyers, MD; Nelson, PA [address], [place], [place] [address] [phone] Mon-Fri 8:00-5:00 Babies seen by providers at [facility] Does NOT accept Medicaid Limited appointment availability, please call early in hospitalization  [facility] [place] Kunedd, DO; McGowen, MD [address], [place], [place] [address] [phone] Mon-Fri 8:00-5:00 Babies seen by [facility] providers Does NOT accept Medicaid [facility] [place] Cameron, MD; MacDonald, MD; Michaels, PA; Nayak, MD [address] [place] [address], [place], [place] [address] [phone] Mon-Fri 8:00-5:00 After hours clinic ([address] [place],  [address]) [phone] Mon-Fri 5:00-8:00, Sat 12:00-6:00, Sun 10:00-4:00 Babies seen by [facility] providers Accepting Medicaid [facility] [place] [address], [place], [place]  [address] [phone]   Fax - [phone]  [place] ([address]) [facility] [place] [facility] Andy, MD [address], [place], [place] [address] [phone] Mon-Fri 8:00-5:00 Babies seen by [facility] providers Does NOT accept Medicaid [facility] [place] ([facility] [place]) Eksir, MD [address], [place], [place] [address] [phone] Mon-Thur 8:00-7:00, Fri 8:00-5:00, Sat 8:00-12:00 Babies seen by providers at [facility] Accepting Medicaid - but does not have vaccinations in office (must be received elsewhere) Limited availability, please call early in hospitalization  [place] ([address]) [facility]  Charlene Flemming, MD [address],  [place] [address] [phone]  Fax [phone]  [facility]  Adisynn Suleiman, MD, Annamarie Streilein, PA, Carla Hampton, PA [address] [place], [place]  [address] [phone] [facility]  [address], [place], [place] [address] [phone] [address], [place], [place] [address] [phone] (West Office)  [facility] [address], [place], [place] [address] [phone] [facility] [address] [place], [place], [place] [address] [phone] [facility] [address], [place], [place] [address] [phone] [facility] [address], [place], [place] [address] [phone] [facility] [address], [place], [place] [address] [phone] [facility] [address], [place], [place] [address] [phone] [facility]  [address], [place], [place] [address] [phone] Dr[facility] [address] [place] [address], [place], [place] [address] [phone] [facility] [address], [place],  [address] [phone] [facility] [address], ,  [address] [phone]  

## 2022-03-24 NOTE — Progress Notes (Signed)
Pt has glucose readings in meter today.

## 2022-03-25 ENCOUNTER — Encounter: Payer: Self-pay | Admitting: Family Medicine

## 2022-03-31 ENCOUNTER — Other Ambulatory Visit: Payer: Self-pay

## 2022-03-31 ENCOUNTER — Other Ambulatory Visit: Payer: Self-pay | Admitting: Advanced Practice Midwife

## 2022-03-31 ENCOUNTER — Ambulatory Visit (INDEPENDENT_AMBULATORY_CARE_PROVIDER_SITE_OTHER): Payer: Federal, State, Local not specified - PPO | Admitting: Family Medicine

## 2022-03-31 VITALS — BP 117/82 | HR 84 | Wt 235.1 lb

## 2022-03-31 DIAGNOSIS — O26899 Other specified pregnancy related conditions, unspecified trimester: Secondary | ICD-10-CM

## 2022-03-31 DIAGNOSIS — Z3A38 38 weeks gestation of pregnancy: Secondary | ICD-10-CM

## 2022-03-31 DIAGNOSIS — O24410 Gestational diabetes mellitus in pregnancy, diet controlled: Secondary | ICD-10-CM

## 2022-03-31 DIAGNOSIS — O0990 Supervision of high risk pregnancy, unspecified, unspecified trimester: Secondary | ICD-10-CM

## 2022-03-31 DIAGNOSIS — Z6791 Unspecified blood type, Rh negative: Secondary | ICD-10-CM

## 2022-03-31 NOTE — Progress Notes (Signed)
  Subjective:  Lori Compton is a 31 y.o. G3P1011 at 76w2dbeing seen today for ongoing prenatal care.  She is currently monitored for the following issues for this high-risk pregnancy and has Idiopathic intracranial hypertension; Depression; Supervision of high risk pregnancy, antepartum; Eczema; Gestational diabetes mellitus (GDM), antepartum; and Rh negative state in antepartum period on their problem list.  Patient reports no complaints.  Contractions: Irritability. Vag. Bleeding: None.  Movement: Present. Denies leaking of fluid.   The following portions of the patient's history were reviewed and updated as appropriate: allergies, current medications, past family history, past medical history, past social history, past surgical history and problem list. Problem list updated.  Objective:   Vitals:   03/31/22 1453  BP: 117/82  Pulse: 84  Weight: 235 lb 1.6 oz (106.6 kg)    Fetal Status: Fetal Heart Rate (bpm): 131   Movement: Present     General:  Alert, oriented and cooperative. Patient is in no acute distress.  Skin: Skin is warm and dry. No rash noted.   Cardiovascular: Normal heart rate noted  Respiratory: Normal respiratory effort, no problems with respiration noted  Abdomen: Soft, gravid, appropriate for gestational age. Pain/Pressure: Present     Pelvic: Vag. Bleeding: None     Cervical exam performed       2cm/60/-2 to -3, membrane sweep done  Extremities: Normal range of motion.  Edema: None  Mental Status: Normal mood and affect. Normal behavior. Normal judgment and thought content.    Assessment and Plan:  Pregnancy: G3P1011 at 351w2d1. Supervision of high risk pregnancy, antepartum Doing well. No acute concerns. Fetal heart rate and appropriate - IOL scheduled in on 7/11 (at 39 weeks for A1GDM)  2. Diet controlled gestational diabetes mellitus (GDM), antepartum BG today (brought in monitor) within goal, all fastings except for one was <90, all 2 hr PP <120. ,  EFW on 6/26 51%ile and normal AFI.  IOL at 39 weeks scheduled at last PNV  3. Rh negative state in antepartum period Plan for PP Rhogam study on fetus and give Rhogam if indicated  4. [redacted] weeks gestation of pregnancy   Term labor symptoms and general obstetric precautions including but not limited to vaginal bleeding, contractions, leaking of fluid and fetal movement were reviewed in detail with the patient. Please refer to After Visit Summary for other counseling recommendations.   Follow up in 6 weeks PP and GTT  AnRenard MatterMD, MPH OB Fellow, Faculty Practice

## 2022-04-02 ENCOUNTER — Inpatient Hospital Stay (HOSPITAL_COMMUNITY)
Admission: AD | Admit: 2022-04-02 | Discharge: 2022-04-03 | DRG: 807 | Disposition: A | Discharge status: Home / Self Care | Payer: Federal, State, Local not specified - PPO | Attending: Family Medicine | Admitting: Family Medicine

## 2022-04-02 ENCOUNTER — Other Ambulatory Visit: Payer: Self-pay

## 2022-04-02 ENCOUNTER — Encounter (HOSPITAL_COMMUNITY): Payer: Self-pay | Admitting: Family Medicine

## 2022-04-02 DIAGNOSIS — Z6791 Unspecified blood type, Rh negative: Secondary | ICD-10-CM | POA: Diagnosis not present

## 2022-04-02 DIAGNOSIS — O24419 Gestational diabetes mellitus in pregnancy, unspecified control: Secondary | ICD-10-CM | POA: Diagnosis present

## 2022-04-02 DIAGNOSIS — O26893 Other specified pregnancy related conditions, third trimester: Secondary | ICD-10-CM | POA: Diagnosis not present

## 2022-04-02 DIAGNOSIS — O24420 Gestational diabetes mellitus in childbirth, diet controlled: Secondary | ICD-10-CM | POA: Diagnosis not present

## 2022-04-02 DIAGNOSIS — Z3A38 38 weeks gestation of pregnancy: Secondary | ICD-10-CM

## 2022-04-02 DIAGNOSIS — O26899 Other specified pregnancy related conditions, unspecified trimester: Secondary | ICD-10-CM

## 2022-04-02 DIAGNOSIS — Z3492 Encounter for supervision of normal pregnancy, unspecified, second trimester: Secondary | ICD-10-CM

## 2022-04-02 DIAGNOSIS — O0990 Supervision of high risk pregnancy, unspecified, unspecified trimester: Secondary | ICD-10-CM

## 2022-04-02 LAB — TYPE AND SCREEN
ABO/RH(D): AB NEG
Antibody Screen: POSITIVE

## 2022-04-02 LAB — CBC
HCT: 40.3 % (ref 36.0–46.0)
Hemoglobin: 13.6 g/dL (ref 12.0–15.0)
MCH: 28 pg (ref 26.0–34.0)
MCHC: 33.7 g/dL (ref 30.0–36.0)
MCV: 82.9 fL (ref 80.0–100.0)
Platelets: 192 10*3/uL (ref 150–400)
RBC: 4.86 MIL/uL (ref 3.87–5.11)
RDW: 14.5 % (ref 11.5–15.5)
WBC: 8.3 10*3/uL (ref 4.0–10.5)
nRBC: 0 % (ref 0.0–0.2)

## 2022-04-02 LAB — RPR: RPR Ser Ql: NONREACTIVE

## 2022-04-02 MED ORDER — LACTATED RINGERS IV SOLN: INTRAVENOUS | Status: DC

## 2022-04-02 MED ORDER — SIMETHICONE 80 MG PO CHEW: 80.0000 mg | CHEWABLE_TABLET | ORAL | Status: DC | PRN

## 2022-04-02 MED ORDER — PRENATAL MULTIVITAMIN CH
1.0000 | ORAL_TABLET | Freq: Every day | ORAL | Status: DC
  Administered 2022-04-02 – 2022-04-03 (×2): 1 via ORAL
  Filled 2022-04-02 (×2): qty 1

## 2022-04-02 MED ORDER — TERBUTALINE SULFATE 1 MG/ML IJ SOLN: 0.2500 mg | Freq: Once | INTRAMUSCULAR | Status: DC | PRN

## 2022-04-02 MED ORDER — OXYCODONE-ACETAMINOPHEN 5-325 MG PO TABS: 2.0000 | ORAL_TABLET | ORAL | Status: DC | PRN

## 2022-04-02 MED ORDER — ACETAMINOPHEN 325 MG PO TABS: 650.0000 mg | ORAL_TABLET | ORAL | Status: DC | PRN

## 2022-04-02 MED ORDER — DIBUCAINE (PERIANAL) 1 % EX OINT: 1.0000 | TOPICAL_OINTMENT | CUTANEOUS | Status: DC | PRN

## 2022-04-02 MED ORDER — COCONUT OIL OIL: 1.0000 | TOPICAL_OIL | Status: DC | PRN

## 2022-04-02 MED ORDER — RHO D IMMUNE GLOBULIN 1500 UNIT/2ML IJ SOSY
300.0000 ug | PREFILLED_SYRINGE | Freq: Once | INTRAMUSCULAR | Status: DC
Start: 2022-04-02 — End: 2022-04-02
  Filled 2022-04-02: qty 2

## 2022-04-02 MED ORDER — DIPHENHYDRAMINE HCL 25 MG PO CAPS: 25.0000 mg | ORAL_CAPSULE | Freq: Four times a day (QID) | ORAL | Status: DC | PRN

## 2022-04-02 MED ORDER — TETANUS-DIPHTH-ACELL PERTUSSIS 5-2.5-18.5 LF-MCG/0.5 IM SUSY: 0.5000 mL | PREFILLED_SYRINGE | Freq: Once | INTRAMUSCULAR | Status: DC

## 2022-04-02 MED ORDER — SENNOSIDES-DOCUSATE SODIUM 8.6-50 MG PO TABS
2.0000 | ORAL_TABLET | Freq: Every day | ORAL | Status: DC
  Filled 2022-04-02: qty 2

## 2022-04-02 MED ORDER — OXYCODONE-ACETAMINOPHEN 5-325 MG PO TABS: 1.0000 | ORAL_TABLET | ORAL | Status: DC | PRN

## 2022-04-02 MED ORDER — BENZOCAINE-MENTHOL 20-0.5 % EX AERO
1.0000 | INHALATION_SPRAY | CUTANEOUS | Status: DC | PRN
  Filled 2022-04-02: qty 56

## 2022-04-02 MED ORDER — SOD CITRATE-CITRIC ACID 500-334 MG/5ML PO SOLN: 30.0000 mL | ORAL | Status: DC | PRN

## 2022-04-02 MED ORDER — ONDANSETRON HCL 4 MG/2ML IJ SOLN: 4.0000 mg | INTRAMUSCULAR | Status: DC | PRN

## 2022-04-02 MED ORDER — WITCH HAZEL-GLYCERIN EX PADS: 1.0000 | MEDICATED_PAD | CUTANEOUS | Status: DC | PRN

## 2022-04-02 MED ORDER — IBUPROFEN 600 MG PO TABS
600.0000 mg | ORAL_TABLET | Freq: Four times a day (QID) | ORAL | Status: DC
  Administered 2022-04-02 – 2022-04-03 (×5): 600 mg via ORAL
  Filled 2022-04-02 (×5): qty 1

## 2022-04-02 MED ORDER — FENTANYL CITRATE (PF) 100 MCG/2ML IJ SOLN
INTRAMUSCULAR | Status: AC
  Filled 2022-04-02: qty 2

## 2022-04-02 MED ORDER — MEASLES, MUMPS & RUBELLA VAC IJ SOLR: 0.5000 mL | Freq: Once | INTRAMUSCULAR | Status: DC

## 2022-04-02 MED ORDER — OXYTOCIN BOLUS FROM INFUSION
333.0000 mL | Freq: Once | INTRAVENOUS | Status: AC
  Administered 2022-04-02: 333 mL via INTRAVENOUS

## 2022-04-02 MED ORDER — OXYTOCIN-SODIUM CHLORIDE 30-0.9 UT/500ML-% IV SOLN
2.5000 [IU]/h | INTRAVENOUS | Status: DC
  Administered 2022-04-02: 2.5 [IU]/h via INTRAVENOUS
  Filled 2022-04-02: qty 500

## 2022-04-02 MED ORDER — ONDANSETRON HCL 4 MG/2ML IJ SOLN: 4.0000 mg | Freq: Four times a day (QID) | INTRAMUSCULAR | Status: DC | PRN

## 2022-04-02 MED ORDER — ACETAMINOPHEN 325 MG PO TABS
650.0000 mg | ORAL_TABLET | ORAL | Status: DC | PRN
  Administered 2022-04-02 (×2): 650 mg via ORAL
  Filled 2022-04-02 (×2): qty 2

## 2022-04-02 MED ORDER — LACTATED RINGERS IV SOLN: 500.0000 mL | INTRAVENOUS | Status: DC | PRN

## 2022-04-02 MED ORDER — ONDANSETRON HCL 4 MG PO TABS: 4.0000 mg | ORAL_TABLET | ORAL | Status: DC | PRN

## 2022-04-02 MED ORDER — LIDOCAINE HCL (PF) 1 % IJ SOLN
30.0000 mL | INTRAMUSCULAR | Status: AC | PRN
  Administered 2022-04-02: 30 mL via SUBCUTANEOUS
  Filled 2022-04-02: qty 30

## 2022-04-02 MED ORDER — FENTANYL CITRATE (PF) 100 MCG/2ML IJ SOLN
100.0000 ug | INTRAMUSCULAR | Status: DC | PRN
  Administered 2022-04-02: 100 ug via INTRAVENOUS
  Filled 2022-04-02: qty 2

## 2022-04-02 MED ORDER — MISOPROSTOL 50MCG HALF TABLET: 50.0000 ug | ORAL_TABLET | ORAL | Status: DC | PRN

## 2022-04-02 NOTE — MAU Note (Signed)
.  Lori Compton is a 31 y.o. at 78w4dhere in MAU reporting: contractions that began at 0145. Denies SROM, reports pink bloody show. Endorses + fetal movement. Reports membrane swept on Thursday SVE at that time 3/60. Pt plans on having water birth. Onset of complaint: 0145 Pain score: 10 Vitals:   04/02/22 0410  BP: 140/86  Pulse: 85  Resp: 17  Temp: 97.6 F (36.4 C)  SpO2: 100%     FHT:127bpm Lab orders placed from triage:  mau labor GBS - A1GDM

## 2022-04-02 NOTE — Discharge Summary (Signed)
Postpartum Discharge Summary  Date of Service updated***     Patient Name: Lori Compton DOB: Jan 26, 1991 MRN: [id number]  Date of admission: 04/02/2022 Delivery date:04/02/2022  Delivering provider: [name]  Date of discharge: 04/02/2022  Admitting diagnosis: Supervision of high risk pregnancy, antepartum [O09.90] Intrauterine pregnancy: [redacted]w[redacted]d    Secondary diagnosis:  Active Problems:   Indication for care in labor or delivery  Additional problems: ***    Discharge diagnosis: {DX.:23714}                                              Post partum procedures:{Postpartum procedures:23558} Augmentation: N/A Complications: None  Hospital course: Onset of Labor With Vaginal Delivery      31y.o. yo [id number] 338w4das admitted in Active Labor on 04/02/2022. Patient had an uncomplicated labor course as follows:  Membrane Rupture Time/Date: 5:51 AM ,04/02/2022   Delivery Method:Vaginal, Spontaneous  Episiotomy: None  Lacerations:  2nd degree;Vaginal  Patient had an uncomplicated postpartum course.  She is ambulating, tolerating a regular diet, passing flatus, and urinating well. Patient is discharged home in stable condition on 04/02/22.  Newborn Data: Birth date:04/02/2022  Birth time:5:53 AM  Gender:Female  Living status:Living  Apgars:9 ,9  Weight:3610 g   Magnesium Sulfate received: {Mag received:30440022} BMZ received: {BMZ received:30440023} Rhophylac:{Rhophylac received:30440032} MMAOZ:{HYQ:65784696}-DaP: no Flu: No Transfusion:{Transfusion received:30440034}  Physical exam  Vitals:   04/02/22 0704 04/02/22 0715 04/02/22 0730 04/02/22 0745  BP: 131/78 (!) 102/39 (!) 99/47 (!) 145/81  Pulse: 87  78 82  Resp:  _0 Temp:      TempSrc:      SpO2:       General: {Exam; general:21111117} Lochia: {Desc; appropriate/inappropriate:30686::"appropriate"} Uterine Fundus: {Desc; firm/soft:30687} Incision: {Exam; incision:21111123} DVT Evaluation: {Exam;  dvt:2111122} Labs: Lab Results  Component Value Date   WBC 8.3 04/02/2022   HGB 13.6 04/02/2022   HCT 40.3 04/02/2022   MCV 82.9 04/02/2022   PLT 192 04/02/2022      Latest Ref Rng & Units 02/11/2022    2:00 PM  CMP  Glucose 70 - 99 mg/dL 73   BUN 6 - 20 mg/dL 6   Creatinine 0.44 - 1.00 mg/dL 0.74   Sodium 135 - 145 mmol/L 134   Potassium 3.5 - 5.1 mmol/L 4.0   Chloride 98 - 111 mmol/L 105   CO2 22 - 32 mmol/L 21   Calcium 8.9 - 10.3 mg/dL 8.8   Total Protein 6.5 - 8.1 g/dL 6.6   Total Bilirubin 0.3 - 1.2 mg/dL 0.4   Alkaline Phos 38 - 126 U/L 68   AST 15 - 41 U/L 16   ALT 0 - 44 U/L 14    Edinburgh Score:     No data to display           After visit meds:  Allergies as of 04/02/2022   No Known Allergies   Med Rec must be completed prior to using this [facility]        Discharge home in stable condition Infant Feeding: Breast Infant Disposition:{CHL IP OB HOME WITH MOEXBMWU:13244}ischarge instruction: per After Visit Summary and Postpartum booklet. Activity: Advance as tolerated. Pelvic rest for 6 weeks.  Diet: routine diet Future Appointments:No future appointments. Follow up Visit:  [facility] 04/02/2022 by R. DaRenato BattlesCNM Please schedule  this patient for a In person postpartum visit in 6 weeks with the following provider: Any provider. Additional Postpartum F/U: none   High risk pregnancy complicated by:  GDM Delivery mode:  Vaginal, Spontaneous  Anticipated Birth Control:  POPs   04/02/2022 [name], CNM

## 2022-04-02 NOTE — MAU Note (Signed)
Pt vomited large amount-states she is feeling increased vaginal pressure

## 2022-04-02 NOTE — H&P (Addendum)
OBSTETRIC ADMISSION HISTORY AND PHYSICAL  Lori Compton is a 31 y.o. female G3P1011 with IUP at [redacted]w[redacted]d by LMP presenting for SOL. She reports +FMs, No LOF, no VB, no blurry vision, headaches or peripheral edema, and RUQ pain.  She plans on breast feeding. She requests POP for birth control. She received her prenatal care at [facility]  Dating: By LMP --->  Estimated Date of Delivery: 04/12/22  Sono:    '@[redacted]w[redacted]d'$ , CWD, normal anatomy, variable presentation,  337 g, 97% EFW   Prenatal History/Complications:  Depression GDMA1 Rh negative Idiopathic intracranial hypertension  Past Medical History: Past Medical History:  Diagnosis Date   Depression    Fibroid    Gestational diabetes    Idiopathic intracranial hypertension    Migraine     Past Surgical History: Past Surgical History:  Procedure Laterality Date   OOPHORECTOMY Right 2019   WISDOM TOOTH EXTRACTION      Obstetrical History: OB History     Gravida  3   Para  1   Term  1   Preterm  0   AB  1   Living  1      SAB  0   IAB  1   Ectopic  0   Multiple  0   Live Births  1           Social History Social History   Socioeconomic History   Marital status: Significant Other    Spouse name: Not on file   Number of children: Not on file   Years of education: Not on file   Highest education level: Not on file  Occupational History   Not on file  Tobacco Use   Smoking status: Never   Smokeless tobacco: Never  Vaping Use   Vaping Use: Never used  Substance and Sexual Activity   Alcohol use: Not Currently    Alcohol/week: 2.0 standard drinks of alcohol    Types: 1 Glasses of wine, 1 Cans of beer per week    Comment: either beer or wine, 1/week   Drug use: Never   Sexual activity: Yes    Partners: Male  Other Topics Concern   Not on file  Social History Narrative   Not on file   Social Determinants of Health   Financial Resource Strain: Not on file  Food Insecurity: No Food  Insecurity (12/15/2021)   Hunger Vital Sign    Worried About Running Out of Food in the Last Year: Never true    Ran Out of Food in the Last Year: Never true  Transportation Needs: No Transportation Needs (12/15/2021)   PRAPARE - Hydrologist (Medical): No    Lack of Transportation (Non-Medical): No  Physical Activity: Not on file  Stress: Not on file  Social Connections: Not on file    Family History: Family History  Problem Relation Age of Onset   Thyroid disease Mother    Breast cancer Maternal Grandmother    Hypertension Maternal Grandmother    Heart Problems Maternal Aunt     Allergies: No Known Allergies  Pt denies allergies to latex, iodine, or shellfish.  Medications Prior to Admission  Medication Sig Dispense Refill Last Dose   Prenatal Vit-Fe Fumarate-FA (PRENATAL VITAMIN) 27-0.8 MG TABS Take 1 tablet by mouth daily. 30 tablet 3 04/01/2022   Accu-Chek Softclix Lancets lancets Use four times daily as instructed. 100 each 12    Blood Glucose Monitoring Suppl (ACCU-CHEK GUIDE) w/Device KIT  1 Device by Does not apply route in the morning, at noon, in the evening, and at bedtime. 1 kit 0    glucose blood test strip Use as instructed 100 each 12      Review of Systems   All systems reviewed and negative except as stated in HPI  Blood pressure 127/84, pulse 86, temperature 97.6 F (36.4 C), temperature source Oral, resp. rate 17, last menstrual period 07/06/2021, SpO2 99 %. General appearance: alert, cooperative, appears stated age, and no distress Lungs: clear to auscultation bilaterally Heart: regular rate and rhythm Abdomen: soft, non-tender; bowel sounds normal Extremities: Homans sign is negative, no sign of DVT Presentation: cephalic Fetal monitoringBaseline: 130 bpm, Variability: Good {> 6 bpm), Accelerations: Non-reactive but appropriate for gestational age, and Decelerations: Absent Uterine activityFrequency: Every 2-3 minutes and  Duration: 60 seconds Dilation: 4 Effacement (%): 90 Station: -2 Exam by:: [employer] RN   Prenatal labs: ABO, Rh: AB/Negative/-- (12/28 1046) Antibody: Negative (05/11 1414) Rubella: 3.79 (12/28 1046) RPR: Non Reactive (05/11 1414)  HBsAg: Negative (12/28 1046)  HIV: Non Reactive (05/11 1414)  GBS: Negative/-- (06/22 1444)  2 hr Glucola: Abnormal Genetic screening:  Normal Anatomy US: Normal  Prenatal Transfer Tool  Maternal Diabetes: Yes:  Diabetes Type:  Diet controlled Genetic Screening: Normal Maternal Ultrasounds/Referrals: Normal Fetal Ultrasounds or other Referrals:  Fetal echo Maternal Substance Abuse:  No Significant Maternal Medications:  None Significant Maternal Lab Results: Group B Strep negative and Rh negative  No results found for this or any previous visit (from the past 24 hour(s)).  Patient Active Problem List   Diagnosis Date Noted   Rh negative state in antepartum period 01/06/2022   Gestational diabetes mellitus (GDM), antepartum 10/21/2021   Supervision of high risk pregnancy, antepartum 09/08/2021   Eczema 09/08/2021   Idiopathic intracranial hypertension 05/17/2021   Depression 05/17/2021    Assessment/Plan:  Lori Compton is a 31 y.o. G3P1011 at [redacted]w[redacted]d here for SOL  #Labor: Patient presents for SOL and was 4/90/-2 on cervical exam. Expectant management #Pain: Planning epidural #FWB: Category I #ID:  GBS negative #MOF: Breast #MOC: POP #Circ:  Yes  #Rh Negative: Patient received rhogam at 30 wks. Patient to receive rhogam postpartum, if baby Rh +.   #GDMA1: Q4h cbg checks.   Holley Bouche, [place] for [place], [facility] 04/02/2022, 5:24 AM   Attestation of Supervision of Student:  I confirm that I have verified the information documented in the  resident 's note and that I have also personally reperformed the history, physical exam and all medical decision making activities.  I have verified  that all services and findings are accurately documented in this student's note; and I agree with management and plan as outlined in the documentation. I have also made any necessary editorial changes.  Patient presented to L&D at 8.5 cm, rapidly changing to complete and pushing. See Delivery note.  [name], [place] for [employer], [facility] 04/02/2022 8:33 AM

## 2022-04-02 NOTE — Lactation Note (Signed)
This note was copied from a baby's chart. Lactation Consultation Note  Patient Name: Lori Compton GYBWL'S Date: 04/02/2022 Reason for consult: Initial assessment;Early term 37-38.6wks;Breastfeeding assistance;Maternal endocrine disorder Age:30 hours  Infant is 5 hours old. LC entered the room and mom was attempting to latch baby in the cradle hold to the left breast.  Mom asked if Lori Compton could assist her with getting baby latched.   LC showed mom how to sandwich the tissue and support baby's head to get baby latch. Baby held the breast in his mouth for a few seconds and began sucking.   Baby latched deeply with rhythmic sucking and lips flanged.   No swallows were noted.   LC discussed supply and demand, feeding cues, infant behavior, frequency of the feedings, cluster feeding, hand expression, and addressed mom's pumping questions.   LC reviewed outpatient [place] services brochure.   Mom states that she has no further questions or concerns.   Current Feeding Plan:  Breastfeed 8+ times in 24 hours according to feeding cues.  Hand express for extra stimulation and feed the expressed milk to baby via a spoon.  Put baby to the breast first prior to supplementing.  Call [facility] for assistance with latch.  Maternal Data Has patient been taught Hand Expression?: Yes Does the patient have breastfeeding experience prior to this delivery?: Yes How long did the patient breastfeed?: 2 months  Feeding Mother's Current Feeding Choice: Breast Milk and Formula  LATCH Score Latch: Grasps breast easily, tongue down, lips flanged, rhythmical sucking.  Audible Swallowing: None  Type of Nipple: Everted at rest and after stimulation  Comfort (Breast/Nipple): Soft / non-tender  Hold (Positioning): Assistance needed to correctly position infant at breast and maintain latch.  LATCH Score: 7   Lactation Tools Discussed/Used    Interventions Interventions: Breast feeding basics  reviewed;Assisted with latch;Adjust position;Education;LC Services brochure  Discharge Pump: DEBP;Personal  Consult Status Consult Status: Follow-up Date: 04/03/22 Follow-up type: In-patient    Lori Compton 04/02/2022, 11:48 AM

## 2022-04-03 MED ORDER — SENNOSIDES-DOCUSATE SODIUM 8.6-50 MG PO TABS: 2.0000 | ORAL_TABLET | Freq: Every evening | ORAL | 2 refills | Status: DC | PRN

## 2022-04-03 MED ORDER — IBUPROFEN 600 MG PO TABS: 600.0000 mg | ORAL_TABLET | Freq: Four times a day (QID) | ORAL | 2 refills | Status: DC | PRN

## 2022-04-03 MED ORDER — SLYND 4 MG PO TABS: 1.0000 | ORAL_TABLET | Freq: Every day | ORAL | 11 refills | Status: DC

## 2022-04-03 MED ORDER — ACETAMINOPHEN 500 MG PO TABS: 1000.0000 mg | ORAL_TABLET | Freq: Four times a day (QID) | ORAL | 0 refills | Status: DC | PRN

## 2022-04-03 NOTE — Progress Notes (Signed)
Attending Circumcision Counseling Progress Note  Patient desires circumcision for her female infant.  Circumcision procedure details discussed, risks and benefits of procedure were also discussed.  These include but are not limited to: Benefits of circumcision in men include reduction in the rates of urinary tract infection (UTI), penile cancer, some sexually transmitted infections, penile inflammatory and retractile disorders, as well as easier hygiene.  Risks include bleeding , infection, injury of glans which may lead to penile deformity or urinary tract issues, unsatisfactory cosmetic appearance and other potential complications related to the procedure.  It was emphasized that this is an elective procedure.  Patient wants to proceed with circumcision; written informed consent obtained.  Will do circumcision soon, routine circumcision and post circumcision care ordered for the infant.  Verita Schneiders, MD 04/03/2022 10:54 AM

## 2022-04-03 NOTE — Progress Notes (Signed)
CSW received consult for hx of Anxiety and Depression.  CSW met with MOB to offer support and complete assessment. When CSW entered room, MOB was sitting on hospital bed. Infant was not present, as he was receiving a circumcision. CSW introduced self and explained reason for consult. MOB presented as bright on approach and remained engaged throughout the encounter.   CSW inquired about MOB's mental health history. MOB reports she was diagnosed with depression in 2019, stating that her depression was situational, related to being in the [employer]. MOB reports her depression has been in remission since 2021 and expresses no current mental health symptoms.   CSW inquired if MOB experienced postpartum depression with her first child. MOB reports she did experience PPD, marked by feelings of sadness but attributes PPD symptoms again to being in the [employer] and going through a divorce at the time. MOB reports she was previously on Wellbutrin and reports the medication was helpful; however, MOB reports she discontinued the medication due to no longer experiencing depressive symptoms. MOB reports she has attended 1 therapy session through her job in the past. MOB declined mental health resources at this time, stating that she prefers to go to the [place] to seek treatment if she experiences depression in the future.   CSW inquired how MOB has felt emotionally since giving birth. MOB reports she feels "happy." MOB identified praying, listening to music, and talking to a support as coping skills. MOB identified her partner, her mom, and her friends as positive supports.   MOB reports she has all needed items for infant, including a car seat and basinet. MOB has chosen [employer] as infant's pediatrician. MOB declined resource needs at at this time.  CSW provided education regarding the baby blues period vs. perinatal mood disorders, discussed treatment and gave resources for mental health follow up if concerns  arise.  CSW recommends self-evaluation during the postpartum time period using the New Mom Checklist from Postpartum Progress and encouraged MOB to contact a medical professional if symptoms are noted at any time.    CSW provided review of Sudden Infant Death Syndrome (SIDS) precautions.    CSW identifies no further need for intervention and no barriers to discharge at this time.  Signed,  Berniece Salines, MSW, [place], LCASA 04/03/2022 2:55 PM

## 2022-04-05 ENCOUNTER — Inpatient Hospital Stay (HOSPITAL_COMMUNITY): Payer: Federal, State, Local not specified - PPO

## 2022-04-05 ENCOUNTER — Inpatient Hospital Stay (HOSPITAL_COMMUNITY)
Admission: AD | Admit: 2022-04-05 | Payer: Federal, State, Local not specified - PPO | Source: Home / Self Care | Admitting: Family Medicine

## 2022-04-06 NOTE — BH Specialist Note (Signed)
Integrated Behavioral Health via Telemedicine Visit  04/18/2022 Lori Compton [id number]  Number of [place] Clinician visits: 1- Initial Visit  Session Start time: 8828   Session End time: 0034  Total time in minutes: 24   Referring Provider: Darron Doom, MD Patient/Family location: Home [facility] Provider location: [facility] [place] [facility]  All persons participating in visit: Patient Lori Compton and [place]   Types of Service: Individual psychotherapy and Video visit  I connected with Lori Compton and/or Lori Compton's  n/a  via  Telephone or Video Enabled Telemedicine Application  (Video is Caregility application) and verified that I am speaking with the correct person using two identifiers. Discussed confidentiality: Yes   I discussed the limitations of telemedicine and the availability of in person appointments.  Discussed there is a possibility of technology failure and discussed alternative modes of communication if that failure occurs.  I discussed that engaging in this telemedicine visit, they consent to the provision of behavioral healthcare and the services will be billed under their insurance.  Patient and/or legal guardian expressed understanding and consented to Telemedicine visit: Yes   Presenting Concerns: Patient and/or family reports the following symptoms/concerns: Requesting birth control to be re-sent to new pharmacy; adjusting to new motherhood with good social support; sleeping and eating well; bonding well with baby.  Duration of problem: Postpartum; Severity of problem:  Appropriate   Patient and/or Family's Strengths/Protective Factors: Social connections, Social and Emotional competence, Concrete supports in place (healthy food, safe environments, etc.), Sense of purpose, and Physical Health (exercise, healthy diet, medication compliance, etc.)  Goals  Addressed: Patient will:  Increase knowledge and/or ability of: healthy habits   Demonstrate ability to: Increase healthy adjustment to current life circumstances  Progress towards Goals: Ongoing  Interventions: Interventions utilized:  Psychoeducation and/or Health Education, Link to [employer], and Supportive Reflection Standardized Assessments completed: GAD-7 and PHQ 9  Patient and/or Family Response: Patient agrees with treatment plan.   Assessment: Patient currently experiencing Other specified counseling, postpartum mood check.   Patient may benefit from brief therapeutic intervention.  Plan: Follow up with behavioral health clinician on : Call Lori Compton at [phone], as needed. Behavioral recommendations:  -Continue prioritizing healthy self-care (regular meals, adequate rest; allowing practical help from supportive friends and family) until at least postpartum medical appointment -Consider new mom support group as needed at either [email or web address] or [email or web address]  Referral(s): [place] [facility] (In Clinic) and [employer]:  new parent support  I discussed the assessment and treatment plan with the patient and/or parent/guardian. They were provided an opportunity to ask questions and all were answered. They agreed with the plan and demonstrated an understanding of the instructions.   They were advised to call back or seek an in-person evaluation if the symptoms worsen or if the condition fails to improve as anticipated.  Lori Fair, LCSW    04/18/2022    1:59 PM 03/24/2022    4:37 PM 02/24/2022   11:21 AM 12/15/2021   12:04 PM 11/17/2021   11:36 AM  Depression screen PHQ 2/9  Decreased Interest '1 1 1 '$ 0 1  Down, Depressed, Hopeless 1 1 0 0 0  PHQ - 2 Score '2 2 1 '$ 0 1  Altered sleeping 0 '1 1 1 1  '$ Tired, decreased energy '1 1 1 1 1  '$ Change in appetite 0 0 1 0 0  Feeling bad or failure about yourself  0 0 0 0  0  Trouble  concentrating 0 0 0 0 0  Moving slowly or fidgety/restless 0 0 1 0 0  Suicidal thoughts 0 0 0 0 0  PHQ-9 Score '3 4 5 2 3      '$ 04/18/2022    2:01 PM 03/24/2022    4:37 PM 02/24/2022   11:21 AM 12/15/2021   12:05 PM  GAD 7 : Generalized Anxiety Score  Nervous, Anxious, on Edge 0 1 1 0  Control/stop worrying 0 1 1 0  Worry too much - different things 0 1 1 0  Trouble relaxing 0 '1 1 1  '$ Restless 0 0 0 0  Easily annoyed or irritable 0 '1 1 1  '$ Afraid - awful might happen 0 0 0 0  Total GAD 7 Score 0 5 5 2

## 2022-04-07 ENCOUNTER — Encounter: Payer: Federal, State, Local not specified - PPO | Admitting: Family Medicine

## 2022-04-14 ENCOUNTER — Encounter: Payer: Federal, State, Local not specified - PPO | Admitting: Family Medicine

## 2022-04-18 ENCOUNTER — Ambulatory Visit (INDEPENDENT_AMBULATORY_CARE_PROVIDER_SITE_OTHER): Payer: Federal, State, Local not specified - PPO | Admitting: Clinical

## 2022-04-18 DIAGNOSIS — Z7189 Other specified counseling: Secondary | ICD-10-CM

## 2022-04-18 NOTE — Patient Instructions (Signed)
[facility] at  [facility] [address] [place], [place] [address] [phone] (main office) [phone] (Lori Compton's office)  New Parent Support Groups [email or web address]   

## 2022-05-16 ENCOUNTER — Other Ambulatory Visit: Payer: Self-pay

## 2022-05-16 ENCOUNTER — Encounter: Payer: Self-pay | Admitting: Obstetrics and Gynecology

## 2022-05-16 ENCOUNTER — Ambulatory Visit (INDEPENDENT_AMBULATORY_CARE_PROVIDER_SITE_OTHER): Payer: Federal, State, Local not specified - PPO | Admitting: Obstetrics and Gynecology

## 2022-05-16 ENCOUNTER — Other Ambulatory Visit (HOSPITAL_COMMUNITY)
Admission: RE | Admit: 2022-05-16 | Discharge: 2022-05-16 | Disposition: A | Discharge status: Home / Self Care | Payer: Federal, State, Local not specified - PPO | Source: Ambulatory Visit | Attending: Obstetrics and Gynecology | Admitting: Obstetrics and Gynecology

## 2022-05-16 DIAGNOSIS — Z3040 Encounter for surveillance of contraceptives, unspecified: Secondary | ICD-10-CM

## 2022-05-16 MED ORDER — SLYND 4 MG PO TABS: 1.0000 | ORAL_TABLET | Freq: Every day | ORAL | 11 refills | Status: DC

## 2022-05-16 NOTE — Progress Notes (Signed)
Pt reports rash all over body for the last 3 days.

## 2022-05-16 NOTE — Progress Notes (Signed)
[place] Partum Visit Note  Lori Compton is a 31 y.o. G69P2012 female who presents for a postpartum visit. She is 6 weeks postpartum following a normal spontaneous vaginal delivery.  I have fully reviewed the prenatal and intrapartum course. The delivery was at 38/4 gestational weeks.  Anesthesia: none. Postpartum course has been uncomplicated. Baby is doing well. Baby is feeding by breast. Bleeding moderate lochia. Bowel function is normal. Bladder function is normal. Patient is not sexually active. Contraception method is oral progesterone-only contraceptive. Postpartum depression screening: negative.  Pt did note 3 day history of mild body wide rash which was itchy.  She denies change in detergent or skin care regimen.  No one else appears to have the rash.  Pt has not tried anything for treatment.  Benadryl or claritin advised along with aveeno baths.  If symptoms are persistent recommend follow up with PCP   The pregnancy intention screening data noted above was reviewed. Potential methods of contraception were discussed. The patient elected to proceed with POPs   Edinburgh Postnatal Depression Scale - 05/16/22 1528       Edinburgh Postnatal Depression Scale:  In the Past 7 Days   I have been able to laugh and see the funny side of things. 0    I have looked forward with enjoyment to things. 0    I have blamed myself unnecessarily when things went wrong. 0    I have been anxious or worried for no good reason. 0    I have felt scared or panicky for no good reason. 0    Things have been getting on top of me. 0    I have been so unhappy that I have had difficulty sleeping. 0    I have felt sad or miserable. 0    I have been so unhappy that I have been crying. 0    The thought of harming myself has occurred to me. 0    Edinburgh Postnatal Depression Scale Total 0             Health Maintenance Due  Topic Date Due   URINE MICROALBUMIN  Never done   HPV VACCINES (3 - 3-dose  series) 08/15/2011   PAP-Cervical Cytology Screening  Never done   PAP SMEAR-Modifier  Never done   COVID-19 Vaccine (3 - Pfizer series) 10/02/2020   INFLUENZA VACCINE  04/26/2022    The following portions of the patient's history were reviewed and updated as appropriate: allergies, current medications, past family history, past medical history, past social history, past surgical history, and problem list.  Review of Systems Pertinent items are noted in HPI.  Objective:  BP 118/83   Pulse 89   Ht '5\' 4"'$  (1.626 m)   Wt 214 lb 11.2 oz (97.4 kg)   LMP 07/06/2021 (Exact Date)   Breastfeeding Yes   BMI 36.85 kg/m    General:  alert, cooperative, and no distress   Breasts:  not indicated  Lungs: clear to auscultation bilaterally  Heart:  regular rate and rhythm  Abdomen: soft, non-tender; bowel sounds normal; no masses,  no organomegaly   Wound N./a  GU exam:  normal       Assessment:    Encounter for postpartum care  normal postpartum exam.   Plan:   Essential components of care per ACOG recommendations:  1.  Mood and well being: Patient with negative depression screening today. Reviewed local resources for support.  - Patient tobacco use? No.   -  hx of drug use? No.    2. Infant care and feeding:  -Patient currently breastmilk feeding? Yes. Reviewed importance of draining breast regularly to support lactation.  -Social determinants of health (SDOH) reviewed in EPIC. No concerns.  3. Sexuality, contraception and birth spacing - Patient does not want a pregnancy in the next year.  Desired family size is 2 children.  - Reviewed reproductive life planning. Reviewed contraceptive methods based on pt preferences and effectiveness.  Patient desired Oral Contraceptive today.   - Discussed birth spacing of 18 months  4. Sleep and fatigue -Encouraged family/partner/community support of 4 hrs of uninterrupted sleep to help with mood and fatigue  5. Physical Recovery  -  Discussed patients delivery and complications. She describes her labor as good. - Patient had a Vaginal, no problems at delivery. Patient had a 2nd degree laceration. Perineal healing reviewed. Patient expressed understanding - Patient has urinary incontinence? No. - Patient is safe to resume physical and sexual activity  6.  Health Maintenance - HM due items addressed Yes - Last pap smear No results found for: "DIAGPAP" Pap smear done at today's visit.  -Breast Cancer screening indicated? No.   7. Chronic Disease/Pregnancy Condition follow up: Gestational Diabetes Pt will reschedule for 2 hour postpartum GTT - PCP follow up  Griffin Basil, [place] for [place]

## 2022-05-18 ENCOUNTER — Other Ambulatory Visit: Payer: Federal, State, Local not specified - PPO

## 2022-05-19 LAB — CYTOLOGY - PAP: Diagnosis: NEGATIVE

## 2022-05-23 ENCOUNTER — Other Ambulatory Visit: Payer: Federal, State, Local not specified - PPO

## 2022-05-25 ENCOUNTER — Other Ambulatory Visit: Payer: Self-pay

## 2022-05-25 ENCOUNTER — Other Ambulatory Visit: Payer: Federal, State, Local not specified - PPO

## 2022-05-25 ENCOUNTER — Telehealth: Payer: Self-pay | Admitting: Family Medicine

## 2022-05-25 ENCOUNTER — Other Ambulatory Visit: Payer: Self-pay | Admitting: *Deleted

## 2022-05-25 ENCOUNTER — Encounter: Payer: Self-pay | Admitting: *Deleted

## 2022-05-25 DIAGNOSIS — O24429 Gestational diabetes mellitus in childbirth, unspecified control: Secondary | ICD-10-CM

## 2022-05-25 MED ORDER — NORETHINDRONE 0.35 MG PO TABS: 1.0000 | ORAL_TABLET | Freq: Every day | ORAL | 11 refills | Status: DC

## 2022-05-25 NOTE — Telephone Encounter (Signed)
Called pt's pharmacy and confirmed that the Drospirenone Rx would cost in excess of $100 due to she has high deductible insurance. I was told that Norethindrone 0.35 mg would cost approx $19. I called pt and discussed this issue with her.  She stated that she is agreeable to the change if approved by Dr. Elgie Congo.

## 2022-05-25 NOTE — Telephone Encounter (Signed)
Patient said the Birth Control that was prescribed cost to much and want something different.

## 2022-05-26 LAB — GLUCOSE TOLERANCE, 2 HOURS
Glucose, 2 hour: 123 mg/dL (ref 70–139)
Glucose, GTT - Fasting: 84 mg/dL (ref 70–99)

## 2022-06-25 DIAGNOSIS — K0889 Other specified disorders of teeth and supporting structures: Secondary | ICD-10-CM | POA: Diagnosis not present

## 2022-06-25 DIAGNOSIS — H6690 Otitis media, unspecified, unspecified ear: Secondary | ICD-10-CM | POA: Diagnosis not present

## 2022-06-27 DIAGNOSIS — K080 Exfoliation of teeth due to systemic causes: Secondary | ICD-10-CM | POA: Diagnosis not present

## 2022-08-09 DIAGNOSIS — E049 Nontoxic goiter, unspecified: Secondary | ICD-10-CM | POA: Diagnosis not present

## 2022-08-09 DIAGNOSIS — T161XXA Foreign body in right ear, initial encounter: Secondary | ICD-10-CM | POA: Diagnosis not present

## 2022-09-23 DIAGNOSIS — E010 Iodine-deficiency related diffuse (endemic) goiter: Secondary | ICD-10-CM | POA: Diagnosis not present

## 2022-09-23 DIAGNOSIS — E049 Nontoxic goiter, unspecified: Secondary | ICD-10-CM | POA: Diagnosis not present

## 2022-09-28 DIAGNOSIS — E049 Nontoxic goiter, unspecified: Secondary | ICD-10-CM | POA: Diagnosis not present

## 2022-10-24 IMAGING — US US MFM OB FOLLOW-UP
1 series · 13 of 26 positions shown · non-contrast
Comparison: none

[Series 1: us mfm ob follow-up · 13 of 26 slices shown]
[im 2/26]
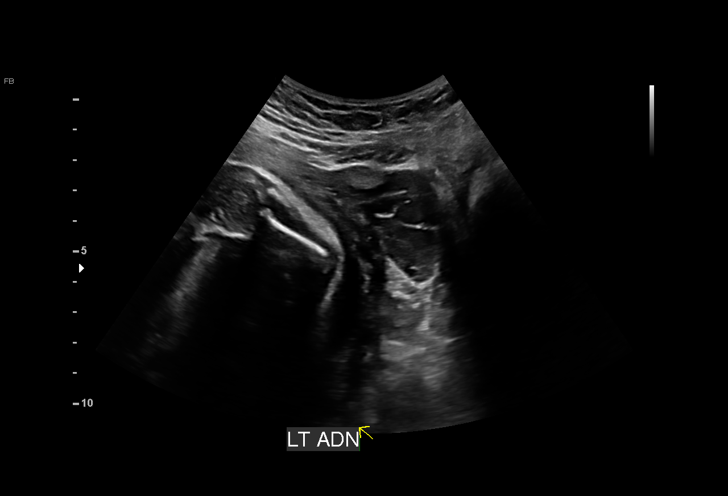
[im 4/26]
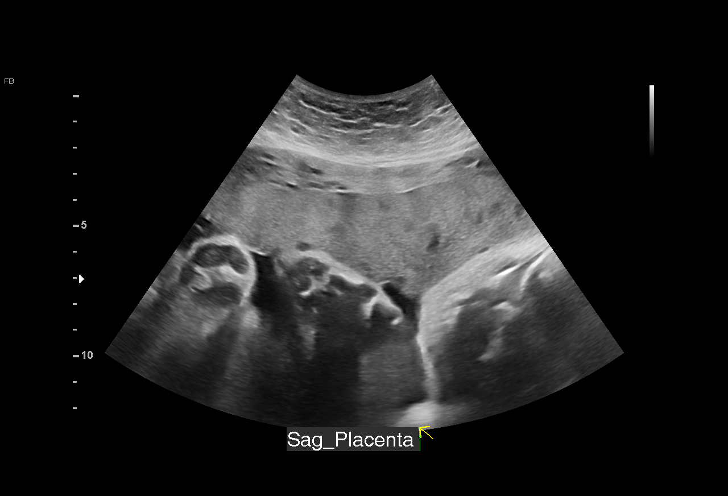
[im 6/26]
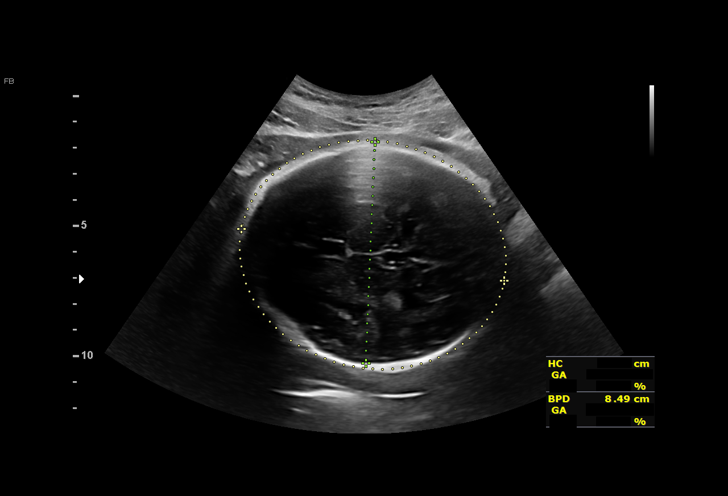
[im 8/26]
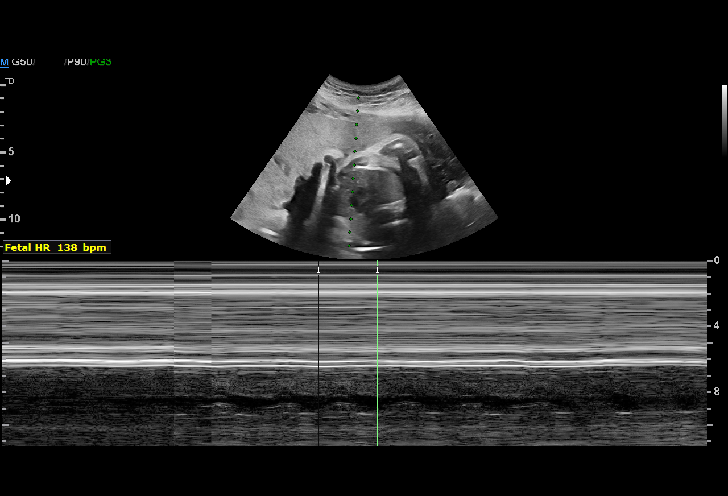
[im 10/26]
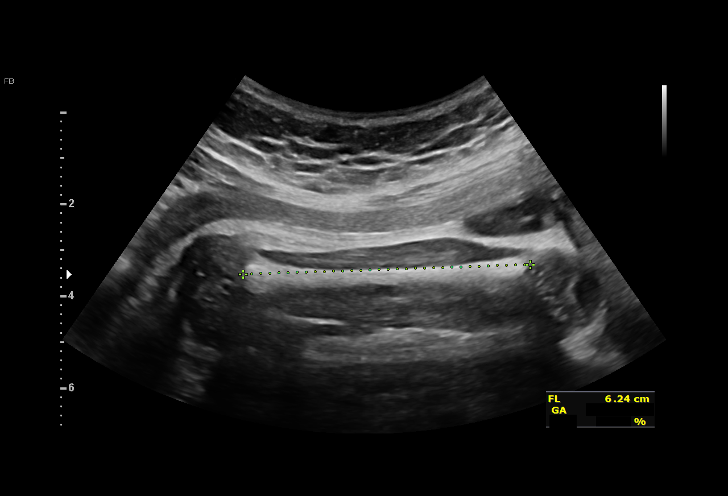
[im 12/26]
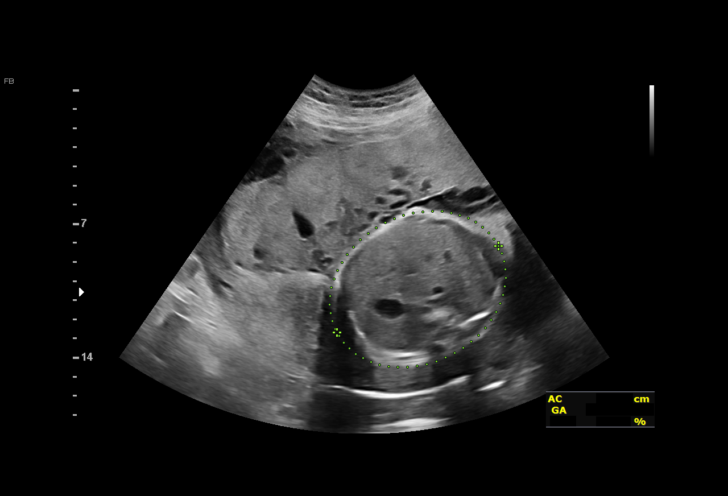
[im 14/26]
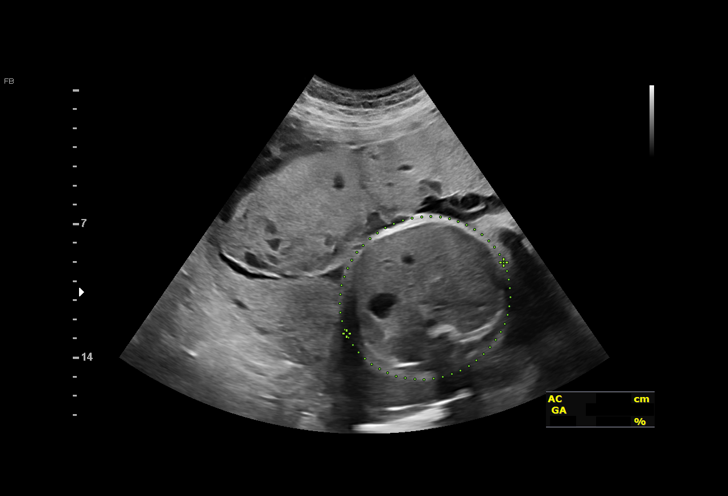
[im 16/26]
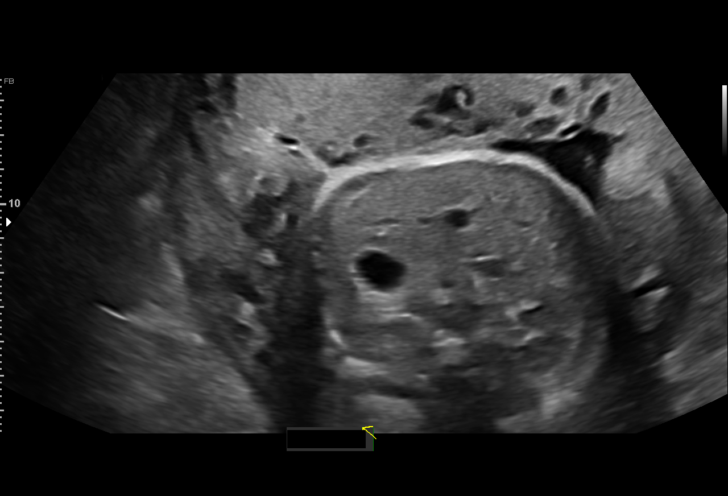
[im 18/26]
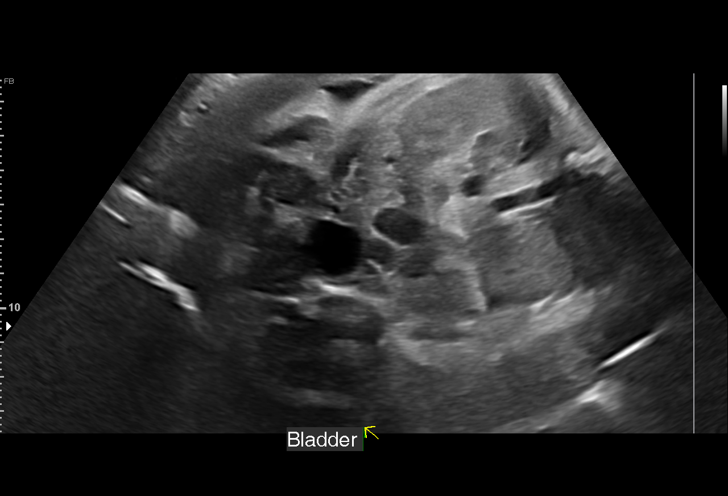
[im 20/26]
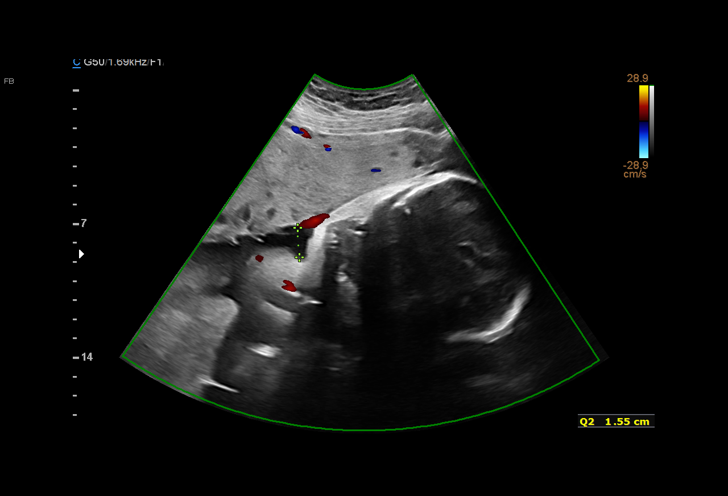
[im 22/26]
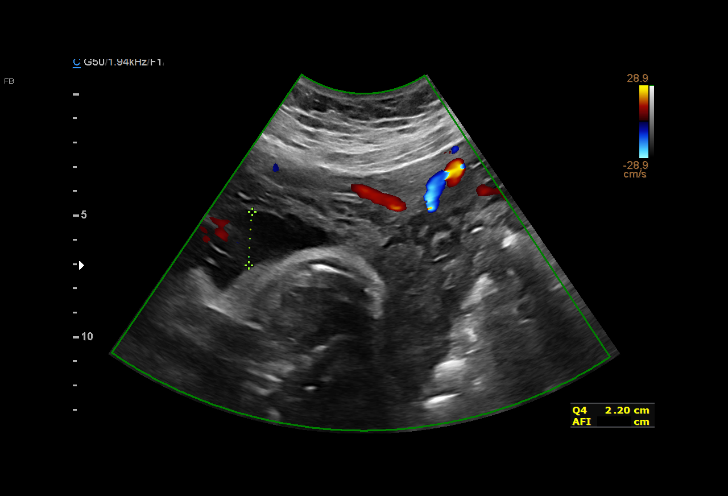
[im 24/26]
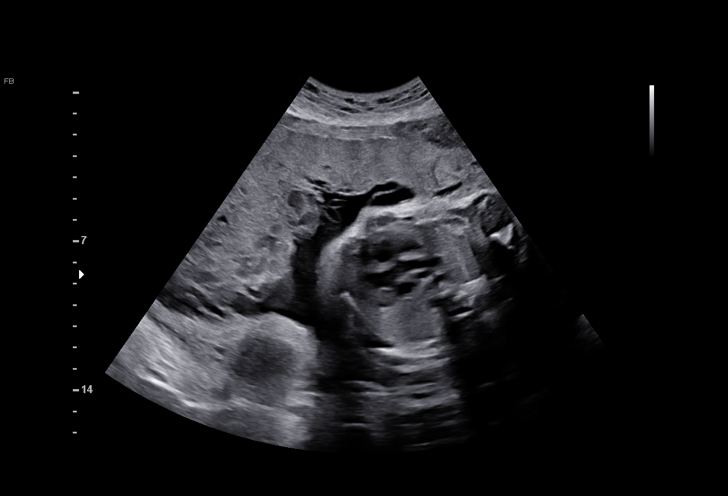
[im 26/26]
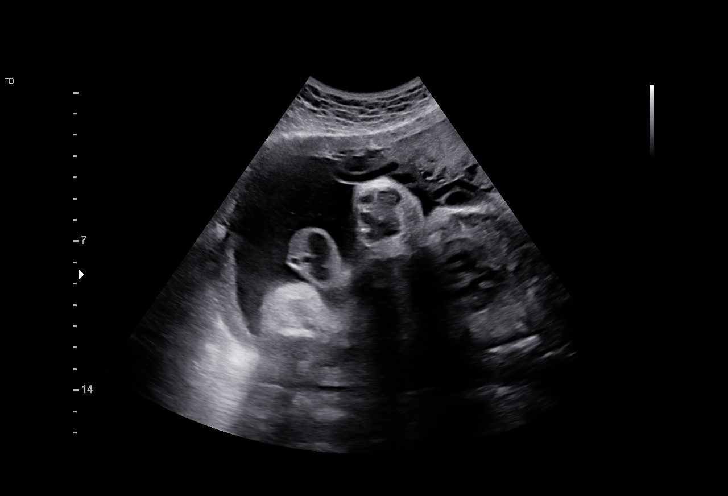

[13 of 26 positions shown; findings below may reference images not displayed]

[REDACTED]care
                   WALKER CNM

Indications

 Gestational diabetes in pregnancy, diet
 controlled
 Obesity complicating pregnancy, third
 trimester (BMI 37)
 Medical complication of pregnancy (IIH)
 31 weeks gestation of pregnancy
 LR NIPS - Male, Negative Horizon, Negative
 AFP
Fetal Evaluation

 Num Of Fetuses:         1
 Fetal Heart Rate(bpm):  138
 Cardiac Activity:       Observed
 Presentation:           Cephalic
 Placenta:               Anterior
 P. Cord Insertion:      Previously Visualized

 Amniotic Fluid
 AFI FV:      Within normal limits

 AFI Sum(cm)     %Tile       Largest Pocket(cm)
 11              23

 RUQ(cm)       RLQ(cm)       LUQ(cm)        LLQ(cm)

Biometry
 BPD:      84.4  mm     G. Age:  34w 0d         92  %    CI:        79.88   %    70 - 86
                                                         FL/HC:      20.6   %    19.1 -
 HC:      298.4  mm     G. Age:  33w 0d         44  %    HC/AC:      1.08        0.96 -
 AC:      275.3  mm     G. Age:  31w 4d         40  %    FL/BPD:     73.0   %    71 - 87
 FL:       61.6  mm     G. Age:  32w 0d         39  %    FL/AC:      22.4   %    20 - 24

 Est. FW:    8207  gm      4 lb 3 oz     45  %
OB History

 Gravidity:    3         Term:   1        Prem:   0        SAB:   0
 TOP:          1       Ectopic:  0        Living: 1
Gestational Age

 LMP:           31w 6d        Date:  07/06/21                 EDD:   04/12/22
 U/S Today:     32w 5d                                        EDD:   04/06/22
 Best:          31w 6d     Det. By:  LMP  (07/06/21)          EDD:   04/12/22
Anatomy

 Cranium:               Appears normal         Aortic Arch:            Previously seen
 Cavum:                 Appears normal         Ductal Arch:            Previously seen
 Ventricles:            Previously seen        Diaphragm:              Previously seen
 Choroid Plexus:        Previously seen        Stomach:                Appears normal, left
                                                                       sided
 Cerebellum:            Previously seen        Abdomen:                Appears normal
 Posterior Fossa:       Previously seen        Abdominal Wall:         Previously seen
 Nuchal Fold:           Previously seen        Cord Vessels:           Previously seen
 Face:                  Orbits and profile     Kidneys:                Appear normal
                        previously seen
 Lips:                  Previously seen        Bladder:                Appears normal
 Thoracic:              Appears normal         Spine:                  Previously seen
 Heart:                 Previously seen        Upper Extremities:      Previously seen
 RVOT:                  Previously seen        Lower Extremities:      Previously seen
 LVOT:                  Previously seen

 Other:  Fetus appears to be a male. Rt hand 5th, Lt hand open/5th. Heels,
         feet, Lenses, nasal bone, 3VV prev visualized. Technically difficult
         due to maternal habitus and fetal position.
Cervix Uterus Adnexa

 Adnexa
 No abnormality visualized.
Comments

 This patient was seen for a follow up growth scan due to diet
 controlled gestational diabetes and maternal obesity.  She
 denies any problems since her last exam.
 She was informed that the fetal growth and amniotic fluid
 level appears appropriate for her gestational age.
 Due to gestational diabetes, delivery should probably occur at
 around 39 weeks.
 A follow-up growth scan was scheduled in 5 weeks to
 estimate the fetal weight closer to delivery.
 Weekly fetal testing should be started at 32 weeks should
 she require insulin or metformin for treatment of gestational
 diabetes.

## 2022-10-31 NOTE — Progress Notes (Unsigned)
SUBJECTIVE:   CHIEF COMPLAINT / HPI:   Hx of gestational diabetes Patient would like her A1c checked today  Heavy periods Last period January 29th LMP first day.  Has had 3-4.'s since giving birth. Usually by third day this slows down but on this loss.  She had to constantly change her pad.  She originally took birth control pills right after giving birth but took them for a little while and does not want to be on any kind of birth control now.  She has been having periods monthly.  Only taking prenatal and B12, nothing else. Lasts for 5-6 days.  She says that  Right ankle pain Right ankle has been painful on the medial aspect for a while.  She says that sometimes when she touches it it hurts.  Plays basketball and pain has been going on since 2010.  She wears heels it exacerbates the pain.  Sometimes she can hear clicking and she feels like sometimes her ankle is weak and she rolls it more often.  She has never gotten imaging of this before.  No direct trauma that she has had recently.    Fatigue Has been feeling tired and has been having issues with weight loss.  She has been at the same weight and sometimes she gains weight.  Says that this has been going on for 4 years but worsening.  She is going to be seen by endocrinology in May for her subclinical hyperthyroidism.  Vag discharge She has noticed some increase in discharge and odor recently.  She declines wanting any STD testing.  But she would like to be tested on wet prep.  PERTINENT  PMH / PSH:   OBJECTIVE:   BP 110/83   Pulse 95   Ht '5\' 3"'$  (1.6 m)   Wt 225 lb 2 oz (102.1 kg)   LMP 10/05/2022   SpO2 98%   BMI 39.88 kg/m   General: Well appearing, NAD, awake, alert, responsive to questions Head: Normocephalic atraumatic CV: Regular rate and rhythm no murmurs rubs or gallops Respiratory: Clear to ausculation bilaterally, no wheezes rales or crackles, chest rises symmetrically,  no increased work of breathing Abdomen:  Soft, non-tender, non-distended, normoactive bowel sounds  Extremities: Moves upper and lower extremities freely, no edema in LE; neurovascular intact in lower extremity bilaterally, nontender to palpation of lateral malleolus, medial malleolus, navicular or base of first metatarsal, +DP/PT Pelvic: VULVA: normal appearing vulva with no masses, tenderness or lesions, VAGINA: Normal appearing vagina with normal color, no lesions, with scant discharge present, CERVIX: No lesions, scant discharge present  Chaperone Tashira Legette CMA present for pelvic exam   ASSESSMENT/PLAN:   Gestational diabetes mellitus (GDM), antepartum A1c in prediabetes range today.  -monitor  Menorrhagia with regular cycle Will check with ultrasound to ensure that there is no fibroid that is causing this increase in bleeding.  Patient does not want to be on any birth control which I discussed this is the most common treatment for menorrhagia with IUD/Depo however she declines this today. - US Pelvic Complete With Transvaginal; Future -Follow-up after ultrasound completed -CBC   Chronic fatigue Recently had TSH that was low with normal T4.  Has endocrinology follow-up in May.  Will check baseline CBC and BMP.  Postpartum can also contribute to her fatigue. -CBC and BMP -Endocrinology follow-up  Chronic pain of right ankle Chronic ankle pain. Does not appear to be a fracture. Likely related to this strained ligament. -PT referral  Vaginal  discharge Wet prep negative. Will message patient - POCT Wet Prep ([place])  Gerrit Heck, MD [place]

## 2022-11-01 ENCOUNTER — Encounter: Payer: Self-pay | Admitting: Student

## 2022-11-01 ENCOUNTER — Ambulatory Visit (INDEPENDENT_AMBULATORY_CARE_PROVIDER_SITE_OTHER): Payer: Federal, State, Local not specified - PPO | Admitting: Student

## 2022-11-01 VITALS — BP 110/83 | HR 95 | Ht 63.0 in | Wt 225.1 lb

## 2022-11-01 DIAGNOSIS — R7303 Prediabetes: Secondary | ICD-10-CM

## 2022-11-01 DIAGNOSIS — O24410 Gestational diabetes mellitus in pregnancy, diet controlled: Secondary | ICD-10-CM

## 2022-11-01 DIAGNOSIS — Z8759 Personal history of other complications of pregnancy, childbirth and the puerperium: Secondary | ICD-10-CM

## 2022-11-01 DIAGNOSIS — N898 Other specified noninflammatory disorders of vagina: Secondary | ICD-10-CM | POA: Diagnosis not present

## 2022-11-01 DIAGNOSIS — N920 Excessive and frequent menstruation with regular cycle: Secondary | ICD-10-CM

## 2022-11-01 DIAGNOSIS — G8929 Other chronic pain: Secondary | ICD-10-CM

## 2022-11-01 DIAGNOSIS — M25571 Pain in right ankle and joints of right foot: Secondary | ICD-10-CM

## 2022-11-01 DIAGNOSIS — R5382 Chronic fatigue, unspecified: Secondary | ICD-10-CM | POA: Diagnosis not present

## 2022-11-01 LAB — POCT WET PREP (WET MOUNT)
Clue Cells Wet Prep Whiff POC: NEGATIVE
Trichomonas Wet Prep HPF POC: ABSENT
WBC, Wet Prep HPF POC: NONE SEEN

## 2022-11-01 LAB — POCT GLYCOSYLATED HEMOGLOBIN (HGB A1C): Hemoglobin A1C: 5.7 % — AB (ref 4.0–5.6)

## 2022-11-01 NOTE — Assessment & Plan Note (Signed)
A1c in prediabetes range today.  -monitor

## 2022-11-01 NOTE — Assessment & Plan Note (Signed)
Will check with ultrasound to ensure that there is no fibroid that is causing this increase in bleeding.  Patient does not want to be on any birth control which I discussed this is the most common treatment for menorrhagia with IUD/Depo however she declines this today. - US Pelvic Complete With Transvaginal; Future -Follow-up after ultrasound completed -CBC

## 2022-11-01 NOTE — Assessment & Plan Note (Signed)
Recently had TSH that was low with normal T4.  Has endocrinology follow-up in May.  Will check baseline CBC and BMP.  Postpartum can also contribute to her fatigue. -CBC and BMP -Endocrinology follow-up

## 2022-11-01 NOTE — Assessment & Plan Note (Signed)
Chronic ankle pain. Does not appear to be a fracture. Likely related to this strained ligament. -PT referral

## 2022-11-01 NOTE — Patient Instructions (Signed)
It was great to see you! Thank you for allowing me to participate in your care!   I recommend that you always bring your medications to each appointment as this makes it easy to ensure we are on the correct medications and helps [place] not miss when refills are needed.  Our plans for today:  - we got a wet prep - I can refer you to PT for your ankle pain - we can get a pelvic ultrasound - for fatigue let's check some of your electrolytes and blood levels - follow up with me after the ultrasound  We are checking some labs today, I will call you if they are abnormal will send you a MyChart message or a letter if they are normal.  If you do not hear about your labs in the next 2 weeks please let us know.  Take care and seek immediate care sooner if you develop any concerns. Please remember to show up 15 minutes before your scheduled appointment time!  Gerrit Heck, MD [place]

## 2022-11-02 LAB — BASIC METABOLIC PANEL
BUN/Creatinine Ratio: 17 (ref 9–23)
BUN: 13 mg/dL (ref 6–20)
CO2: 21 mmol/L (ref 20–29)
Calcium: 9 mg/dL (ref 8.7–10.2)
Chloride: 105 mmol/L (ref 96–106)
Creatinine, Ser: 0.78 mg/dL (ref 0.57–1.00)
Glucose: 117 mg/dL — ABNORMAL HIGH (ref 70–99)
Potassium: 3.8 mmol/L (ref 3.5–5.2)
Sodium: 141 mmol/L (ref 134–144)
eGFR: 105 mL/min/{1.73_m2} (ref >59)

## 2022-11-02 LAB — CBC
Hematocrit: 37.3 % (ref 34.0–46.6)
Hemoglobin: 12.4 g/dL (ref 11.1–15.9)
MCH: 28 pg (ref 26.6–33.0)
MCHC: 33.2 g/dL (ref 31.5–35.7)
MCV: 84 fL (ref 79–97)
Platelets: 242 10*3/uL (ref 150–450)
RBC: 4.43 x10E6/uL (ref 3.77–5.28)
RDW: 13.1 % (ref 11.7–15.4)
WBC: 6 10*3/uL (ref 3.4–10.8)

## 2023-01-26 DIAGNOSIS — E049 Nontoxic goiter, unspecified: Secondary | ICD-10-CM | POA: Diagnosis not present

## 2023-01-26 DIAGNOSIS — E0590 Thyrotoxicosis, unspecified without thyrotoxic crisis or storm: Secondary | ICD-10-CM | POA: Diagnosis not present

## 2023-02-21 DIAGNOSIS — Z3201 Encounter for pregnancy test, result positive: Secondary | ICD-10-CM | POA: Diagnosis not present

## 2023-02-21 DIAGNOSIS — N925 Other specified irregular menstruation: Secondary | ICD-10-CM | POA: Diagnosis not present

## 2023-02-25 DIAGNOSIS — M545 Low back pain, unspecified: Secondary | ICD-10-CM | POA: Diagnosis not present

## 2023-02-25 DIAGNOSIS — Z3A01 Less than 8 weeks gestation of pregnancy: Secondary | ICD-10-CM | POA: Diagnosis not present

## 2023-02-25 DIAGNOSIS — O209 Hemorrhage in early pregnancy, unspecified: Secondary | ICD-10-CM | POA: Diagnosis not present

## 2023-02-26 ENCOUNTER — Inpatient Hospital Stay (EMERGENCY_DEPARTMENT_HOSPITAL)
Admission: AD | Admit: 2023-02-26 | Discharge: 2023-02-27 | Disposition: A | Discharge status: Home / Self Care | Payer: Federal, State, Local not specified - PPO | Source: Home / Self Care | Attending: Family Medicine | Admitting: Family Medicine

## 2023-02-26 ENCOUNTER — Inpatient Hospital Stay (HOSPITAL_COMMUNITY)
Admission: EM | Admit: 2023-02-26 | Discharge: 2023-02-26 | Disposition: A | Discharge status: Home / Self Care | Payer: Federal, State, Local not specified - PPO | Attending: Obstetrics and Gynecology | Admitting: Obstetrics and Gynecology

## 2023-02-26 ENCOUNTER — Encounter (HOSPITAL_COMMUNITY): Payer: Self-pay

## 2023-02-26 DIAGNOSIS — Z3A09 9 weeks gestation of pregnancy: Secondary | ICD-10-CM

## 2023-02-26 DIAGNOSIS — O021 Missed abortion: Secondary | ICD-10-CM

## 2023-02-26 DIAGNOSIS — Z23 Encounter for immunization: Secondary | ICD-10-CM | POA: Diagnosis not present

## 2023-02-26 DIAGNOSIS — Z6791 Unspecified blood type, Rh negative: Secondary | ICD-10-CM

## 2023-02-26 DIAGNOSIS — O039 Complete or unspecified spontaneous abortion without complication: Secondary | ICD-10-CM

## 2023-02-26 DIAGNOSIS — M549 Dorsalgia, unspecified: Secondary | ICD-10-CM | POA: Diagnosis not present

## 2023-02-26 DIAGNOSIS — O4691 Antepartum hemorrhage, unspecified, first trimester: Secondary | ICD-10-CM

## 2023-02-26 DIAGNOSIS — O3680X0 Pregnancy with inconclusive fetal viability, not applicable or unspecified: Secondary | ICD-10-CM

## 2023-02-26 DIAGNOSIS — O209 Hemorrhage in early pregnancy, unspecified: Secondary | ICD-10-CM | POA: Insufficient documentation

## 2023-02-26 DIAGNOSIS — Z3A01 Less than 8 weeks gestation of pregnancy: Secondary | ICD-10-CM | POA: Diagnosis not present

## 2023-02-26 DIAGNOSIS — O26891 Other specified pregnancy related conditions, first trimester: Secondary | ICD-10-CM | POA: Diagnosis not present

## 2023-02-26 DIAGNOSIS — O4690 Antepartum hemorrhage, unspecified, unspecified trimester: Secondary | ICD-10-CM

## 2023-02-26 DIAGNOSIS — O99891 Other specified diseases and conditions complicating pregnancy: Secondary | ICD-10-CM | POA: Insufficient documentation

## 2023-02-26 LAB — RH IG WORKUP (INCLUDES ABO/RH)
ABO/RH(D): AB NEG
Antibody Screen: NEGATIVE
Gestational Age(Wks): 6
Unit division: 0

## 2023-02-26 LAB — CBC
HCT: 40.1 % (ref 36.0–46.0)
Hemoglobin: 13.1 g/dL (ref 12.0–15.0)
MCH: 27.5 pg (ref 26.0–34.0)
MCHC: 32.7 g/dL (ref 30.0–36.0)
MCV: 84.1 fL (ref 80.0–100.0)
Platelets: 253 10*3/uL (ref 150–400)
RBC: 4.77 MIL/uL (ref 3.87–5.11)
RDW: 13.4 % (ref 11.5–15.5)
WBC: 6.5 10*3/uL (ref 4.0–10.5)
nRBC: 0 % (ref 0.0–0.2)

## 2023-02-26 LAB — I-STAT BETA HCG BLOOD, ED (MC, WL, AP ONLY): I-stat hCG, quantitative: >2000 m[IU]/mL — ABNORMAL HIGH (ref <5)

## 2023-02-26 MED ORDER — RHO D IMMUNE GLOBULIN 1500 UNIT/2ML IJ SOSY
300.0000 ug | PREFILLED_SYRINGE | Freq: Once | INTRAMUSCULAR | Status: AC
  Administered 2023-02-26: 300 ug via INTRAMUSCULAR
  Filled 2023-02-26: qty 2

## 2023-02-26 NOTE — ED Notes (Signed)
Report called to MAU  

## 2023-02-26 NOTE — MAU Note (Signed)
Mild cramps. Scant blood, no more clots.  Rhophyllac hand out given, pt has received in the past, no problems.

## 2023-02-26 NOTE — MAU Provider Note (Signed)
History    Event Date/Time   First Provider Initiated Contact with Patient 02/26/23 1627      Chief Complaint:  Vaginal Bleeding and Back Pain   Lori Compton is  32 y.o. [id number] Patient's last menstrual period was 12/26/2022.Marland Kitchen Patient is here for vaginal bleeding. Was seen at [facility] last night for same.  Ultrasound showed 6-week 1 day intrauterine pregnancy without cardiac activity.  Has follow-up ultrasound scheduled at [facility] on Thursday, 03/02/2023.  Rh-.  Per patient report and review of [facility] from [facility] she did not receive RhoGAM last night.  ROS Abdominal Pain: Denies Vaginal bleeding: Passed 2 coin-Sized clots. Denies passage of tissue Dizziness: Denies  AB NEG  Her previous US on 02/26/23 at [facility] were:  Chesser, Roselind Rily, MD - 02/26/2023 Formatting of this note might be different from the original. CLINICAL DATA:  Vaginal bleeding and back pain in the first trimester.  EXAM: OBSTETRIC <14 WK [place] AND TRANSVAGINAL OB  US COLOR DOPPLER ULTRASOUND OF OVARIES  TECHNIQUE: Both transabdominal and transvaginal ultrasound examinations were performed for complete evaluation of the gestation as well as the maternal uterus, adnexal regions, and pelvic cul-de-sac. Transvaginal technique was performed to assess early pregnancy.  Color Doppler ultrasound was utilized to evaluate blood flow to the ovaries.  COMPARISON:  None Available.  FINDINGS: Intrauterine gestational sac: Single, with slightly irregular shape.  Yolk sac:  Visualized.  4.6 mm.  Embryo:  Visualized.  Cardiac Activity: Not Visualized.  MSD: 14.8 mm   6 w   2 d  CRL:   4.3 mm   6 w 1 d                  [place] EDC: 10/20/2023  Subchorionic hemorrhage:  None visualized.  Maternal uterus/adnexae: The uterus is anteverted and measures 12.8 x 6.6 x 7.2 cm (320.5 mL).  There is a hypoechoic transmural ventral fibroid in the body of uterus measuring 2.2 cm, another fibroid with patchy  dystrophic calcification noted in the ventral lower uterine segment measuring 2.6 cm and also transmural.  The right ovary has been removed. The left ovary is 2.4 x 1.5 x 2.3 cm and unremarkable with color flow. Volume is 4.1 mL.  No free fluid or adnexal mass are observed. Visualized bladder is unremarkable. The cervix is closed.  IMPRESSION: 1. Six weeks 1 day embryo with no detectable cardiac activity. Findings are suspicious but not diagnostic of a failed pregnancy. Early dates living pregnancy is also possible. Follow-up as indicated. 2. No visible subchorionic hemorrhage, no cervical funneling or shortening. 3. Ventral and dorsal fibroids as above.   Electronically Signed   By: Almira Bar M.D.   On: 02/26/2023 00:19   Physical Exam   Patient Vitals for the past 24 hrs:  BP Temp Temp src Pulse Resp SpO2 Height Weight  02/26/23 1601 131/76 98.1 F (36.7 C) Oral 77 18 99 % 5\' 4"  (1.626 m) 104 kg  02/26/23 1423 119/70 98.3 F (36.8 C) -- 76 18 100 % -- --   Constitutional: Well-nourished female in no apparent distress. No pallor Neuro: Alert and oriented 4 Cardiovascular: Normal rate Respiratory: Normal effort and rate Abdomen: Deferred Gynecological Exam: Deferred due to recent exam and minimal change in bleeding.  Labs: Results for orders placed or performed during the hospital encounter of 02/26/23 (from the past 24 hour(s))  I-Stat beta hCG blood, ED   Collection Time: 02/26/23  2:34 PM  Result Value Ref Range  I-stat hCG, quantitative >2,000.0 (H) <5 mIU/mL   Comment 3          CBC   Collection Time: 02/26/23  3:14 PM  Result Value Ref Range   WBC 6.5 4.0 - 10.5 K/uL   RBC 4.77 3.87 - 5.11 MIL/uL   Hemoglobin 13.1 12.0 - 15.0 g/dL   HCT 16.1 09.6 - 04.5 %   MCV 84.1 80.0 - 100.0 fL   MCH 27.5 26.0 - 34.0 pg   MCHC 32.7 30.0 - 36.0 g/dL   RDW 40.9 81.1 - 91.4 %   Platelets 253 150 - 400 K/uL   nRBC 0.0 0.0 - 0.2 %  Rh IG workup (includes  ABO/Rh)   Collection Time: 02/26/23  4:54 PM  Result Value Ref Range   Gestational Age(Wks) 6    ABO/RH(D) AB NEG    Antibody Screen NEG    Unit Number [id number]    Blood Component Type RHIG    Unit division 00    Status of Unit ISSUED    Transfusion Status      OK TO TRANSFUSE Performed at [facility], [address] [place], [place] [address]     Ultrasound Studies:   Repeat ultrasound not indicated due to ultrasound less than 24 hours ago and minimal change in bleeding not concerning for miscarriage.  MAU course/MDM: Orders Placed This Encounter  Procedures   CBC   I-Stat beta hCG blood, ED   Rh IG workup (includes ABO/Rh)   Meds ordered this encounter  Medications   rho (d) immune globulin (RHIG/RHOPHYLAC) injection 300 mcg     Assessment: 1. Rh negative, antepartum, first trimester - I-Stat beta hCG blood, ED; Standing - CBC; Standing - I-Stat beta hCG blood, ED - CBC - Rh IG workup (includes ABO/Rh); Standing - Rh IG workup (includes ABO/Rh) - rho (d) immune globulin (RHIG/RHOPHYLAC) injection 300 mcg - Discharge patient  2. Vaginal bleeding in pregnant patient after first trimester - I-Stat beta hCG blood, ED; Standing - CBC; Standing - I-Stat beta hCG blood, ED - CBC - Rh IG workup (includes ABO/Rh); Standing - Rh IG workup (includes ABO/Rh) - rho (d) immune globulin (RHIG/RHOPHYLAC) injection 300 mcg - Discharge patient  3. Pregnancy with uncertain fetal viability, single or unspecified fetus - I-Stat beta hCG blood, ED; Standing - CBC; Standing - I-Stat beta hCG blood, ED - CBC - Rh IG workup (includes ABO/Rh); Standing - Rh IG workup (includes ABO/Rh) - rho (d) immune globulin (RHIG/RHOPHYLAC) injection 300 mcg - Discharge patient  4. [redacted] weeks gestation of pregnancy - I-Stat beta hCG blood, ED; Standing - CBC; Standing - I-Stat beta hCG blood, ED - CBC - Rh IG workup (includes ABO/Rh); Standing - Rh IG workup (includes  ABO/Rh) - rho (d) immune globulin (RHIG/RHOPHYLAC) injection 300 mcg - Discharge patient    Plan: Discharge home in stable condition. SAB precautions  Follow-up Information     Ob/Gyn, Nestor Ramp Follow up on 03/02/2023.   Why: As scheduled Contact information: [address] [place] [address] [phone]         [facility] Follow up.   Specialty: Obstetrics and Gynecology Why: As needed in emergencies Contact information: [address] [id number] [address] [place] [address] [phone]                Allergies as of 02/26/2023   No Known Allergies      Medication List  TAKE these medications    cyanocobalamin 500 MCG tablet Commonly known as: VITAMIN B12 Take 500 mcg by mouth daily.   PRENATAL PO Take 1 tablet by mouth daily.        Katrinka Blazing, [place], CNM 02/26/2023, 6:21 PM  2/3

## 2023-02-26 NOTE — ED Triage Notes (Addendum)
Pt [redacted] weeks pregnant c/o vaginal bleeding with blood clots that started today. Light bleeding only noted when she wipes. Also c/o of cramping and lower back pain.

## 2023-02-26 NOTE — MAU Note (Signed)
Lori Compton is a 32 y.o. at [redacted]w[redacted]d by LMP. Sent up from ED.  here in MAU reporting: last 2 days was having dark brownish pink d/c. , changed to red bleeding today(not soaking pads- sees it when she wipes) and passed 2 quarter sized clots.  Last night was seen at [facility] [place], showed no cardiac activity- could not give a dx- has appt for next Thu at [facility] office for repeat US. Marland Kitchen  Has seen OB, saw a sac w/fetal pole last Tues.  Mild cramps- started today and back ache  for a couple days.  Onset of complaint: 2 days Pain score: 5 Vitals:   02/26/23 1423 02/26/23 1601  BP: 119/70 131/76  Pulse: 76 77  Resp: 18 18  Temp: 98.3 F (36.8 C) 98.1 F (36.7 C)  SpO2: 100% 99%      Lab orders placed from triage:

## 2023-02-26 NOTE — ED Provider Triage Note (Signed)
Emergency Medicine Provider Triage Evaluation Note  Lori Compton , a 32 y.o. female  was evaluated in triage.  Pt complains of vaginal bleeding.  Review of Systems  Positive:  Negative:   Physical Exam  BP 119/70 (BP Location: Right Arm)   Pulse 76   Temp 98.3 F (36.8 C)   Resp 18   SpO2 100%  Gen:   Awake, no distress   Resp:  Normal effort  MSK:   Moves extremities without difficulty  Other:    Medical Decision Making  Medically screening exam initiated at 3:06 PM.  Appropriate orders placed.  Lori Compton was informed that the remainder of the evaluation will be completed by another provider, this initial triage assessment does not replace that evaluation, and the importance of remaining in the ED until their evaluation is complete.  [id number]. Currently [redacted] weeks pregnant. Pt endorses vaginal "spotting" x2 days. Endorses passing large blood clots and heavier bleeding x2 hours. Endorses mild cramping. ED room visit last night with transvaginal US showing no cardiac activity (pevlic exam not done at the time) and told to follow up with GYN.  Denies fever, chills, chest pain, dyspnea, abdominal pain, nausea, vomit.     Lori Compton, [place] 02/26/23 1511

## 2023-02-27 ENCOUNTER — Encounter (HOSPITAL_COMMUNITY): Payer: Self-pay | Admitting: Family Medicine

## 2023-02-27 DIAGNOSIS — Z3A09 9 weeks gestation of pregnancy: Secondary | ICD-10-CM | POA: Diagnosis not present

## 2023-02-27 DIAGNOSIS — O039 Complete or unspecified spontaneous abortion without complication: Secondary | ICD-10-CM

## 2023-02-27 LAB — RH IG WORKUP (INCLUDES ABO/RH)

## 2023-02-27 NOTE — MAU Note (Signed)
.  Lori Compton is a 32 y.o. at [redacted]w[redacted]d here in MAU reporting: Seen earlier today for vaginal bleeding and cramping-was sent home and told to look for passing tissue.  Passed tissue at 2300. States she continues to have bleeding-first pad on currently. Reports saturated 2 pads prior to passing tissue. Continues to have uterine cramping- rates 7. Has not taking any medication for pain or cramping.  Onset of complaint: 2300 Pain score: 7 Vitals:   02/27/23 0021  BP: 126/77  Pulse: 85  Resp: 17  Temp: 98.1 F (36.7 C)  SpO2: 100%      Lab orders placed from triage:  None

## 2023-02-27 NOTE — MAU Note (Signed)
RN collected, completed, and packaged the [place] Miscarriage Test Kit and dropped it off in the [facility] cooler in the comfort room.

## 2023-02-27 NOTE — MAU Provider Note (Signed)
History     CSN: [id number]  Arrival date and time: 02/26/23 2346   None     Chief Complaint  Patient presents with   Miscarriage   Lori Compton is a 32 y.o. [id number] at [redacted]w[redacted]d who presented earlier today for vaginal bleeding and cramping. [place] found to not have fetal cardiac activity the day prior. She was sent home from this MAU for expectant management. She is now back because she passed tissue at 2300.  Is continuing to have vaginal bleeding. Reports saturated 2 pads prior to passing tissue. Continues to have uterine cramping- rates 7. Has not taking any medication for pain or cramping.  OB History     Gravida  4   Para  2   Term  2   Preterm  0   AB  1   Living  2      SAB  0   IAB  1   Ectopic  0   Multiple  0   Live Births  2           Past Medical History:  Diagnosis Date   Depression    Fibroid    Gestational diabetes    Idiopathic intracranial hypertension    Migraine     Past Surgical History:  Procedure Laterality Date   OOPHORECTOMY Right 2019   WISDOM TOOTH EXTRACTION      Family History  Problem Relation Age of Onset   Thyroid disease Mother    Breast cancer Maternal Grandmother    Hypertension Maternal Grandmother    Heart Problems Maternal Aunt     Social History   Tobacco Use   Smoking status: Never   Smokeless tobacco: Never  Vaping Use   Vaping Use: Never used  Substance Use Topics   Alcohol use: Not Currently    Alcohol/week: 2.0 standard drinks of alcohol    Types: 1 Glasses of wine, 1 Cans of beer per week    Comment: either beer or wine, 1/week   Drug use: Never    Allergies: No Known Allergies  Medications Prior to Admission  Medication Sig Dispense Refill Last Dose   Prenatal Vit-Fe Fumarate-FA (PRENATAL PO) Take 1 tablet by mouth daily.   02/26/2023   cyanocobalamin (VITAMIN B12) 500 MCG tablet Take 500 mcg by mouth daily.       Review of Systems  Constitutional:  Negative for activity change and  fever.  Respiratory:  Negative for shortness of breath.   Cardiovascular:  Negative for chest pain.  Gastrointestinal:  Negative for abdominal pain.  Genitourinary:  Positive for vaginal bleeding.   Physical Exam   Blood pressure 126/77, pulse 85, temperature 98.1 F (36.7 C), temperature source Oral, resp. rate 17, height 5\' 4"  (1.626 m), weight 105.2 kg, last menstrual period 12/26/2022, SpO2 100 %, currently breastfeeding.  Physical Exam Vitals reviewed. Exam conducted with a chaperone present.  Constitutional:      General: She is not in acute distress.    Appearance: She is not ill-appearing or toxic-appearing.  Eyes:     Extraocular Movements: Extraocular movements intact.  Cardiovascular:     Rate and Rhythm: Normal rate and regular rhythm.     Heart sounds: Normal heart sounds.  Pulmonary:     Effort: Pulmonary effort is normal.     Breath sounds: Normal breath sounds.  Abdominal:     Palpations: Abdomen is soft.     Tenderness: There is no abdominal tenderness. There is no guarding.  Genitourinary:    Exam position: Lithotomy position.     Comments: There is evidence of bleeding. No active bleeding. No tissue at cervical os. Neurological:     Mental Status: She is alert and oriented to person, place, and time.  Psychiatric:        Mood and Affect: Mood normal.        Behavior: Behavior normal.     MAU Course  Procedures  MDM Results for orders placed or performed during the hospital encounter of 02/26/23 (from the past 24 hour(s))  I-Stat beta hCG blood, ED     Status: Abnormal   Collection Time: 02/26/23  2:34 PM  Result Value Ref Range   I-stat hCG, quantitative >2,000.0 (H) <5 mIU/mL   Comment 3          CBC     Status: None   Collection Time: 02/26/23  3:14 PM  Result Value Ref Range   WBC 6.5 4.0 - 10.5 K/uL   RBC 4.77 3.87 - 5.11 MIL/uL   Hemoglobin 13.1 12.0 - 15.0 g/dL   HCT 16.1 09.6 - 04.5 %   MCV 84.1 80.0 - 100.0 fL   MCH 27.5 26.0 - 34.0  pg   MCHC 32.7 30.0 - 36.0 g/dL   RDW 40.9 81.1 - 91.4 %   Platelets 253 150 - 400 K/uL   nRBC 0.0 0.0 - 0.2 %  Rh IG workup (includes ABO/Rh)     Status: None (Preliminary result)   Collection Time: 02/26/23  4:54 PM  Result Value Ref Range   Gestational Age(Wks) 6    ABO/RH(D) AB NEG    Antibody Screen NEG    Unit Number [id number]    Blood Component Type RHIG    Unit division 00    Status of Unit ISSUED    Transfusion Status      OK TO TRANSFUSE Performed at [facility], [address], [place], [place] [address]      Assessment and Plan  1. Inevitable complete miscarriage without complication Passed tissue at home 1 hours prior. Vaginal bleeding stable with initial exam and remains stable upon recheck. No evidence of POC at cervical os. Condolences offered which were appreciated.  -Strict return precautions given   2. [redacted] weeks gestation of pregnancy  3. Missed abortion - Anora Miscarriage Test - Fresh; Standing - Anora Miscarriage Test - Fresh  4. Rh negative status during pregnancy in first trimester -rhogam given with prior MAU admission today   Aeriana Speece Autry-Lott 02/27/2023, 12:44 AM

## 2023-03-02 DIAGNOSIS — O039 Complete or unspecified spontaneous abortion without complication: Secondary | ICD-10-CM | POA: Diagnosis not present

## 2023-03-02 DIAGNOSIS — Z6839 Body mass index (BMI) 39.0-39.9, adult: Secondary | ICD-10-CM | POA: Diagnosis not present

## 2023-03-06 LAB — ANORA MISCARRIAGE TEST - FRESH

## 2023-03-31 ENCOUNTER — Ambulatory Visit: Payer: Federal, State, Local not specified - PPO | Admitting: Student

## 2023-03-31 ENCOUNTER — Encounter: Payer: Self-pay | Admitting: Student

## 2023-03-31 VITALS — BP 123/77 | HR 80 | Ht 64.0 in | Wt 231.0 lb

## 2023-03-31 DIAGNOSIS — F321 Major depressive disorder, single episode, moderate: Secondary | ICD-10-CM

## 2023-03-31 DIAGNOSIS — F32A Depression, unspecified: Secondary | ICD-10-CM | POA: Diagnosis not present

## 2023-03-31 MED ORDER — BUPROPION HCL ER (XL) 150 MG PO TB24
ORAL_TABLET | ORAL | 0 refills | Status: DC
Start: 2023-03-31 — End: 2023-04-02
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
## 2023-03-31 NOTE — Patient Instructions (Signed)
It was great to see you! Thank you for allowing me to participate in your care!   Our plans for today:  - I have sent in wellbutrin for you, please follow up in 1 month - Start counseling through work - [place] are doing amazing!  Take care and seek immediate care sooner if you develop any concerns.  Levin Erp, MD

## 2023-04-02 ENCOUNTER — Encounter: Payer: Self-pay | Admitting: Student

## 2023-04-02 MED ORDER — BUPROPION HCL ER (XL) 150 MG PO TB24
ORAL_TABLET | ORAL | 0 refills | Status: DC
Start: 2023-04-02 — End: 2023-05-30

## 2023-04-02 NOTE — Assessment & Plan Note (Signed)
Has had good success with wellbutrin in the past. She would prefer restarting this rather than starting an SSRI. No obvious contraindications to use. She is going to start counseling through her employer. Therapeutic hug given today - buPROPion (WELLBUTRIN XL) 150 MG 24 hr tablet; Take 1 tablet (150 mg total) by mouth daily for 3 days, THEN 1 tablet (150 mg total) 2 (two) times daily.  Dispense: 90 tablet; Refill: 0  -Counseling -1 month follow up, or sooner if needed

## 2023-05-29 ENCOUNTER — Other Ambulatory Visit: Payer: Self-pay | Admitting: Student

## 2023-05-29 DIAGNOSIS — F321 Major depressive disorder, single episode, moderate: Secondary | ICD-10-CM

## 2023-06-27 ENCOUNTER — Encounter: Payer: Self-pay | Admitting: Student

## 2023-09-28 ENCOUNTER — Ambulatory Visit: Payer: Federal, State, Local not specified - PPO | Admitting: Student

## 2023-09-28 VITALS — BP 125/78 | HR 87 | Ht 64.0 in | Wt 234.6 lb

## 2023-09-28 DIAGNOSIS — G932 Benign intracranial hypertension: Secondary | ICD-10-CM | POA: Diagnosis not present

## 2023-09-28 DIAGNOSIS — Z6841 Body Mass Index (BMI) 40.0 and over, adult: Secondary | ICD-10-CM | POA: Diagnosis not present

## 2023-09-28 DIAGNOSIS — E66813 Obesity, class 3: Secondary | ICD-10-CM

## 2023-09-28 LAB — POCT GLYCOSYLATED HEMOGLOBIN (HGB A1C): Hemoglobin A1C: 5.8 % — AB (ref 4.0–5.6)

## 2023-09-28 MED ORDER — ACETAZOLAMIDE 250 MG PO TABS
250.0000 mg | ORAL_TABLET | Freq: Two times a day (BID) | ORAL | 0 refills | Status: DC
Start: 2023-09-28 — End: 2023-12-15

## 2023-09-28 MED ORDER — SUMATRIPTAN SUCCINATE 50 MG PO TABS
50.0000 mg | ORAL_TABLET | ORAL | 0 refills | Status: DC | PRN
Start: 2023-09-28 — End: 2023-12-15

## 2023-09-28 MED ORDER — TIRZEPATIDE-WEIGHT MANAGEMENT 2.5 MG/0.5ML ~~LOC~~ SOLN
2.5000 mg | SUBCUTANEOUS | 0 refills | Status: DC
Start: 2023-09-28 — End: 2023-10-21

## 2023-09-28 NOTE — Progress Notes (Signed)
    SUBJECTIVE:   CHIEF COMPLAINT / HPI:   Weight gain She has been struggling with weight loss despite regular exercise and dieting for the past two to three months.  She also mentions that her thyroid  has been previously noted to be enlarged, and she is unsure if this could be affecting her ability to lose weight.  Patient underwent thyroid  ultrasound previously which showed presence of slightly enlarged thyroid  gland however no suspicious thyroid  nodules were noted. Denies any history of gallbladder issue, pancreatitis, gastroparesis, personal or family history of MEN 1 or 2  Headaches Every other day 4 times a week---lasts overnight varies Blurry vision intermitteningly floaters with headaches Few years since been on diamox  due to pregnancy She reports frequent headaches and lightheadedness, occurring approximately four times a week and sometimes lasting overnight.  She has not been monitoring her blood sugar levels since her last pregnancy (hx of GDM) and is concerned that her diabetes may be contributing to her current symptoms.  She reports intermittent blurry vision associated with her headaches and occasionally sees floaters. She has not been vomiting.  PERTINENT  PMH / PSH: IIH, chronic fatigue, hx GDM  OBJECTIVE:   BP 125/78   Pulse 87   Ht 5' 4 (1.626 m)   Wt 234 lb 9.6 oz (106.4 kg)   LMP 09/16/2023 (Exact Date)   SpO2 99%   BMI 40.27 kg/m   General: Well appearing, NAD, awake, alert, responsive to questions Head: Normocephalic atraumatic, mildly enlarged thyroid  CV: Regular rate and rhythm no murmurs rubs or gallops Respiratory: Clear to ausculation bilaterally, no wheezes rales or crackles, chest rises symmetrically,  no increased work of breathing Extremities: Moves upper and lower extremities freely, no edema in LE Neuro: CN II: PERRL CN III, IV,VI: EOMI CV V: Normal sensation in V1, V2, V3 CVII: Symmetric smile and brow raise CN VIII: Normal hearing CN  IX,X: Symmetric palate raise  CN XI: 5/5 shoulder shrug CN XII: Symmetric tongue protrusion  UE and LE strength 5/5 Normal sensation in UE and LE bilaterally   ASSESSMENT/PLAN:   Assessment & Plan Class 3 severe obesity due to excess calories without serious comorbidity with body mass index (BMI) of 40.0 to 44.9 in adult [facility]) Despite regular exercise and dieting, weight has been increasing. No red flags for GLP-1.  -Check A1C and TSH today -Zepbound  2.5 mg weekly, if not approved will refer to [facility] -Discussed increasing fiber while on GLP-1 Idiopathic intracranial hypertension History of IIH and increased headaches recently.  Neurologic exam benign. Patient is certain she is not pregnant. - acetaZOLAMIDE  250 mg BID - SUMAtriptan  (IMITREX ) 50 MG tablet PRN (max 2 in 24 hours) -referral to neurology -ED/return precautions discussed    Wendel Lesch, MD [facility]

## 2023-09-28 NOTE — Assessment & Plan Note (Signed)
 History of IIH and increased headaches recently.  Neurologic exam benign. Patient is certain she is not pregnant. - acetaZOLAMIDE  250 mg BID - SUMAtriptan  (IMITREX ) 50 MG tablet PRN (max 2 in 24 hours) -referral to neurology -ED/return precautions discussed

## 2023-09-28 NOTE — Patient Instructions (Addendum)
 It was great to see you! Thank you for allowing me to participate in your care!   Our plans for today:  - I have sent in zepbound  lets see if it gets covered if not I can send you to healthy weight and wellness -For your intracranial hypertension I am sending in Diamox  to take twice a day as well as sumatriptan  hand to take only as needed for really bad headaches--- I am also sending you back to neurology for evaluation -I would like for you to return in 1 to 2 weeks for us  to recheck your labs after starting the Diamox  -Will check your A1c and TSH  Take care and seek immediate care sooner if you develop any concerns.  Wendel Lesch, MD

## 2023-09-29 ENCOUNTER — Encounter: Payer: Self-pay | Admitting: Student

## 2023-09-29 DIAGNOSIS — G932 Benign intracranial hypertension: Secondary | ICD-10-CM

## 2023-09-30 LAB — TSH RFX ON ABNORMAL TO FREE T4: TSH: 0.861 u[IU]/mL (ref 0.450–4.500)

## 2023-10-01 ENCOUNTER — Encounter: Payer: Self-pay | Admitting: Student

## 2023-10-12 DIAGNOSIS — R519 Headache, unspecified: Secondary | ICD-10-CM | POA: Diagnosis not present

## 2023-10-12 DIAGNOSIS — G932 Benign intracranial hypertension: Secondary | ICD-10-CM | POA: Diagnosis not present

## 2023-10-13 DIAGNOSIS — Z01419 Encounter for gynecological examination (general) (routine) without abnormal findings: Secondary | ICD-10-CM | POA: Diagnosis not present

## 2023-10-13 DIAGNOSIS — N760 Acute vaginitis: Secondary | ICD-10-CM | POA: Diagnosis not present

## 2023-10-13 DIAGNOSIS — Z124 Encounter for screening for malignant neoplasm of cervix: Secondary | ICD-10-CM | POA: Diagnosis not present

## 2023-10-20 ENCOUNTER — Other Ambulatory Visit (HOSPITAL_COMMUNITY): Payer: Self-pay

## 2023-10-26 DIAGNOSIS — E0590 Thyrotoxicosis, unspecified without thyrotoxic crisis or storm: Secondary | ICD-10-CM | POA: Diagnosis not present

## 2023-11-23 ENCOUNTER — Ambulatory Visit (INDEPENDENT_AMBULATORY_CARE_PROVIDER_SITE_OTHER): Payer: Federal, State, Local not specified - PPO | Admitting: Family Medicine

## 2023-11-23 ENCOUNTER — Encounter (INDEPENDENT_AMBULATORY_CARE_PROVIDER_SITE_OTHER): Payer: Self-pay | Admitting: Family Medicine

## 2023-11-23 VITALS — BP 108/73 | HR 75 | Temp 98.8°F | Ht 64.0 in | Wt 223.0 lb

## 2023-11-23 DIAGNOSIS — Z6838 Body mass index (BMI) 38.0-38.9, adult: Secondary | ICD-10-CM | POA: Diagnosis not present

## 2023-11-23 DIAGNOSIS — G932 Benign intracranial hypertension: Secondary | ICD-10-CM

## 2023-11-23 DIAGNOSIS — E669 Obesity, unspecified: Secondary | ICD-10-CM | POA: Diagnosis not present

## 2023-11-23 DIAGNOSIS — Z0289 Encounter for other administrative examinations: Secondary | ICD-10-CM

## 2023-11-23 DIAGNOSIS — F321 Major depressive disorder, single episode, moderate: Secondary | ICD-10-CM
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
## 2023-12-15 ENCOUNTER — Ambulatory Visit: Payer: Federal, State, Local not specified - PPO | Admitting: Diagnostic Neuroimaging

## 2023-12-15 ENCOUNTER — Encounter: Payer: Self-pay | Admitting: Diagnostic Neuroimaging

## 2023-12-15 ENCOUNTER — Telehealth: Payer: Self-pay | Admitting: Student

## 2023-12-15 VITALS — BP 119/79 | HR 77 | Ht 64.0 in | Wt 225.2 lb

## 2023-12-15 DIAGNOSIS — G43109 Migraine with aura, not intractable, without status migrainosus: Secondary | ICD-10-CM

## 2023-12-15 NOTE — Patient Instructions (Signed)
  History of possible idiopathic intracranial hypertension (pseudotumor cerebri) diagnosis in 2018 - no LP was done in 2018; no papilledema; not clear if this was correct diagnosis in the past; not likely related to current symptoms; migraine is a better explanation  Migraine with aura - currently pregnant; monitor; tylenol as needed; follow up with ob/gyn

## 2023-12-15 NOTE — Telephone Encounter (Signed)
 Called pt confirmed DOB. Saw positive pregnancy on neurology visit note. She is currently only taking prenatal vitamin no other prescription meds.

## 2023-12-15 NOTE — Progress Notes (Signed)
 [facility]  PATIENT: Lori Compton DOB: 1991-03-30  REFERRING CLINICIAN: Billey Co, MD HISTORY FROM: patient  REASON FOR VISIT: new consult   HISTORICAL  CHIEF COMPLAINT:  Chief Complaint  Patient presents with   New Patient (Initial Visit)    Rm 7, alone.  Headaches/ IIH dx 2018 [facility].  Off meds for now as [redacted] wks pregnant. Dr. Dione Booze, neg. exam. 2 wks ago.  Imaging none recently.  LP never had done.     HISTORY OF PRESENT ILLNESS:   33 year old G45P3 female here for evaluation of headaches.  In 2018 patient had some thunderclap headaches with sensitivity to light, nausea, seeing sparkles, saw a neurologist in [facility], had MRI of the brain showing some subtle changes raising possibility of IIH.  No spinal tap was done.  She was diagnosed with possible IIH diagnosis and started on acetazolamide.  Did not see ophthalmology at that time.  Headaches have increased in the last few months.  Averaging 2-3 headaches per week lasting all day.  Saw ophthalmology and no papilledema was noted.  Referred here for further evaluation.  Since that time patient now is pregnant, approximately 5 weeks estimated gestational age.  Patient has stopped her prescription medications.  She is planning to see on OB/GYN soon.  Family history of migraine in mother.   REVIEW OF SYSTEMS: Full 14 system review of systems performed and negative with exception of: as per HPI.  ALLERGIES: No Known Allergies  HOME MEDICATIONS: Outpatient Medications Prior to Visit  Medication Sig Dispense Refill   Prenatal Vit-Fe Fumarate-FA (PRENATAL PO) Take 1 tablet by mouth daily.     acetaZOLAMIDE (DIAMOX) 250 MG tablet Take 1 tablet (250 mg total) by mouth 2 (two) times daily. (Patient not taking: Reported on 12/15/2023) 60 tablet 0   buPROPion (WELLBUTRIN XL) 150 MG 24 hr tablet TAKE 1 TABLET BY MOUTH ONCE DAILY FOR 30 DAYS THEN 1 TWICE DAILY (Patient not taking: Reported on  12/15/2023) 90 tablet 0   cyanocobalamin (VITAMIN B12) 500 MCG tablet Take 500 mcg by mouth daily. (Patient not taking: Reported on 12/15/2023)     Semaglutide,0.25 or 0.5MG /DOS, (OZEMPIC, 0.25 OR 0.5 MG/DOSE,) 2 MG/1.5ML SOPN Inject 0.25 mg into the skin once a week. (Patient not taking: Reported on 12/15/2023)     SUMAtriptan (IMITREX) 50 MG tablet Take 1 tablet (50 mg total) by mouth every 2 (two) hours as needed for migraine. May repeat in 2 hours if headache persists or recurs. MAX 2 tablets in one day (Patient not taking: Reported on 12/15/2023) 10 tablet 0   No facility-administered medications prior to visit.    PAST MEDICAL HISTORY: Past Medical History:  Diagnosis Date   Depression    Fibroid    Gestational diabetes    Idiopathic intracranial hypertension    Migraine     PAST SURGICAL HISTORY: Past Surgical History:  Procedure Laterality Date   OOPHORECTOMY Right 2019   WISDOM TOOTH EXTRACTION      FAMILY HISTORY: Family History  Problem Relation Age of Onset   Thyroid disease Mother    Hypertension Mother    Heart Problems Maternal Aunt    Breast cancer Maternal Grandmother    Hypertension Maternal Grandmother     SOCIAL HISTORY: Social History   Socioeconomic History   Marital status: Significant Other    Spouse name: Not on file   Number of children: Not on file   Years of education: Not on file  Highest education level: Not on file  Occupational History   Not on file  Tobacco Use   Smoking status: Never   Smokeless tobacco: Never  Vaping Use   Vaping status: Never Used  Substance and Sexual Activity   Alcohol use: Not Currently    Alcohol/week: 2.0 standard drinks of alcohol    Types: 1 Glasses of wine, 1 Cans of beer per week    Comment: either beer or wine, 1/week   Drug use: Never   Sexual activity: Yes    Partners: Male  Other Topics Concern   Not on file  Social History Narrative   Caffiene 1 cup daily   Working:  Scientist, product/process development (remote)   Live  2 kids, fiance   Pregnant [redacted] wks    Social Drivers of Corporate investment banker Strain: Not on file  Food Insecurity: No Food Insecurity (12/15/2021)   Hunger Vital Sign    Worried About Running Out of Food in the Last Year: Never true    Ran Out of Food in the Last Year: Never true  Transportation Needs: No Transportation Needs (12/15/2021)   PRAPARE - Administrator, Civil Service (Medical): No    Lack of Transportation (Non-Medical): No  Physical Activity: Not on file  Stress: Not on file  Social Connections: Not on file  Intimate Partner Violence: Not on file     PHYSICAL EXAM  GENERAL EXAM/CONSTITUTIONAL: Vitals:  Vitals:   12/15/23 0932  BP: 119/79  Pulse: 77  Weight: 225 lb 3.2 oz (102.2 kg)  Height: 5\' 4"  (1.626 m)   Body mass index is 38.66 kg/m. Wt Readings from Last 3 Encounters:  12/15/23 225 lb 3.2 oz (102.2 kg)  11/23/23 223 lb (101.2 kg)  09/28/23 234 lb 9.6 oz (106.4 kg)   Patient is in no distress; well developed, nourished and groomed; neck is supple  CARDIOVASCULAR: Examination of carotid arteries is normal; no carotid bruits Regular rate and rhythm, no murmurs Examination of peripheral vascular system by observation and palpation is normal  EYES: Ophthalmoscopic exam of optic discs and posterior segments is normal; no papilledema or hemorrhages Vision Screening   Right eye Left eye Both eyes  Without correction 20/20 20/20   With correction       MUSCULOSKELETAL: Gait, strength, tone, movements noted in Neurologic exam below  NEUROLOGIC: MENTAL STATUS:      No data to display         awake, alert, oriented to person, place and time recent and remote memory intact normal attention and concentration language fluent, comprehension intact, naming intact fund of knowledge appropriate  CRANIAL NERVE:  2nd - no papilledema on fundoscopic exam 2nd, 3rd, 4th, 6th - pupils equal and reactive to light, visual fields full to  confrontation, extraocular muscles intact, no nystagmus 5th - facial sensation symmetric 7th - facial strength symmetric 8th - hearing intact 9th - palate elevates symmetrically, uvula midline 11th - shoulder shrug symmetric 12th - tongue protrusion midline  MOTOR:  normal bulk and tone, full strength in the BUE, BLE  SENSORY:  normal and symmetric to light touch, temperature, vibration  COORDINATION:  finger-nose-finger, fine finger movements normal  REFLEXES:  deep tendon reflexes 1+ and symmetric  GAIT/STATION:  narrow based gait    DIAGNOSTIC DATA (LABS, IMAGING, TESTING) - I reviewed patient records, labs, notes, testing and imaging myself where available.  Lab Results  Component Value Date   WBC 6.5 02/26/2023   HGB  13.1 02/26/2023   HCT 40.1 02/26/2023   MCV 84.1 02/26/2023   PLT 253 02/26/2023      Component Value Date/Time   NA 141 11/01/2022 1707   K 3.8 11/01/2022 1707   CL 105 11/01/2022 1707   CO2 21 11/01/2022 1707   GLUCOSE 117 (H) 11/01/2022 1707   GLUCOSE 73 02/11/2022 1400   BUN 13 11/01/2022 1707   CREATININE 0.78 11/01/2022 1707   CALCIUM 9.0 11/01/2022 1707   PROT 6.6 02/11/2022 1400   ALBUMIN 3.0 (L) 02/11/2022 1400   AST 16 02/11/2022 1400   ALT 14 02/11/2022 1400   ALKPHOS 68 02/11/2022 1400   BILITOT 0.4 02/11/2022 1400   GFRNONAA >60 02/11/2022 1400   No results found for: "CHOL", "HDL", "LDLCALC", "LDLDIRECT", "TRIG", "CHOLHDL" Lab Results  Component Value Date   HGBA1C 5.8 (A) 09/28/2023   No results found for: "VITAMINB12" Lab Results  Component Value Date   TSH 0.861 09/28/2023      ASSESSMENT AND PLAN  33 y.o. year old female here with:   Dx:  1. Migraine with aura and without status migrainosus, not intractable      PLAN:  History of possible idiopathic intracranial hypertension (pseudotumor cerebri) diagnosis in 2018 - no LP was done in 2018; no papilledema; not clear if this was correct diagnosis in  the past; not likely related to current symptoms; migraine is a better explanation  Migraine with aura - currently pregnant; monitor; tylenol as needed; follow up with ob/gyn  Consider Preventive treatment during pregnancy     Among nutraceutical options, findings suggest that riboflavin (400 mg/day) and coenzyme Q10 (100 mg 3/day) may be effective in preventing migraine if initiated 3 months before pregnancy.  Consider Rescue treatment during pregnancy     Findings indicate that metoclopramide is safe for use in pregnancy, including during the first trimester.     No significant adverse outcomes were noted in a prospective observational cohort study of 432 pregnant women taking triptans. As sumatriptan has the most supporting evidence, this is the recommended option among the triptans.  Adapted from: [email or web address].com/[email or web address]-[email or web address]-[email or web address]-pregnancy-[email or web address]-lactation/  Return for pending if symptoms worsen or fail to improve.    Suanne Marker, MD 12/15/2023, 9:59 AM Certified in Neurology, Neurophysiology and Neuroimaging  [facility] [address] [place], [place] [address] [phone]

## 2024-01-01 ENCOUNTER — Encounter (INDEPENDENT_AMBULATORY_CARE_PROVIDER_SITE_OTHER): Payer: Self-pay | Admitting: Family Medicine

## 2024-01-01 ENCOUNTER — Ambulatory Visit (INDEPENDENT_AMBULATORY_CARE_PROVIDER_SITE_OTHER): Payer: Federal, State, Local not specified - PPO | Admitting: Family Medicine

## 2024-01-01 VITALS — BP 113/70 | HR 75 | Temp 98.1°F | Ht 64.0 in | Wt 222.0 lb

## 2024-01-01 DIAGNOSIS — Z3201 Encounter for pregnancy test, result positive: Secondary | ICD-10-CM | POA: Diagnosis not present

## 2024-01-01 DIAGNOSIS — R5383 Other fatigue: Secondary | ICD-10-CM | POA: Diagnosis not present

## 2024-01-01 DIAGNOSIS — Z1331 Encounter for screening for depression: Secondary | ICD-10-CM

## 2024-01-01 DIAGNOSIS — R7303 Prediabetes: Secondary | ICD-10-CM | POA: Diagnosis not present

## 2024-01-01 DIAGNOSIS — O24410 Gestational diabetes mellitus in pregnancy, diet controlled: Secondary | ICD-10-CM

## 2024-01-01 DIAGNOSIS — G932 Benign intracranial hypertension: Secondary | ICD-10-CM

## 2024-01-01 DIAGNOSIS — F39 Unspecified mood [affective] disorder: Secondary | ICD-10-CM | POA: Diagnosis not present

## 2024-01-01 DIAGNOSIS — E669 Obesity, unspecified: Secondary | ICD-10-CM

## 2024-01-01 DIAGNOSIS — Z3A01 Less than 8 weeks gestation of pregnancy: Secondary | ICD-10-CM

## 2024-01-01 DIAGNOSIS — Z3480 Encounter for supervision of other normal pregnancy, unspecified trimester: Secondary | ICD-10-CM | POA: Diagnosis not present

## 2024-01-01 DIAGNOSIS — Z6838 Body mass index (BMI) 38.0-38.9, adult: Secondary | ICD-10-CM

## 2024-01-01 DIAGNOSIS — F5089 Other specified eating disorder: Secondary | ICD-10-CM

## 2024-01-01 DIAGNOSIS — R0602 Shortness of breath: Secondary | ICD-10-CM | POA: Diagnosis not present

## 2024-01-01 DIAGNOSIS — R5382 Chronic fatigue, unspecified: Secondary | ICD-10-CM

## 2024-01-01 DIAGNOSIS — N925 Other specified irregular menstruation: Secondary | ICD-10-CM | POA: Diagnosis not present

## 2024-01-01 DIAGNOSIS — Z113 Encounter for screening for infections with a predominantly sexual mode of transmission: Secondary | ICD-10-CM | POA: Diagnosis not present

## 2024-01-01 NOTE — Progress Notes (Signed)
 Carlye Grippe, D.O.  ABFM, ABOM Specializing in Clinical Bariatric Medicine Office located at: [address]  [place], [place]  [address]   Bariatric Medicine Visit  Dear Levin Erp, MD   Thank you for referring Lori Compton to our clinic today for evaluation.  We performed a consultation to discuss her options for treatment and educate the patient on her disease state.  The following note includes my evaluation and treatment recommendations.   Please do not hesitate to reach out to me directly if you have any further concerns.   Assessment and Plan:   Orders Placed This Encounter  Procedures   EKG 12-Lead    There are no discontinued medications.   No orders of the defined types were placed in this encounter.   FOR THE DISEASE OF OBESITY:  Obesity (BMI 30-39.9) -- starting BMI 38.09 Assessment & Plan: Recommended Dietary Goals Euline is currently in the action stage of change. As such, her goal is to start our weight management plan.  She has agreed to: Start following Category 4 MP with B/L options   Behavioral Intervention We discussed the following Behavioral Modification Strategies today: avoiding skipping meals, keeping healthy foods at home, and using GPT or another AI platform for recipe ideas- searching "low calorie, low carb, high protein chicken recipes" etc  Additional resources provided today: Handout on CAT 4 meal plan, Handout on CAT 3-4 breakfast options, Handout on CAT 3-4 lunch options, Handout on protein content of various foods, and Provided patient with personalized instruction and demonstration on the use of artificial intelligence for recipes, calorie tracking, and finding healthier options when eating out.   Evidence-based interventions for health behavior change were utilized today including the discussion of self monitoring techniques, problem-solving barriers and SMART goal setting techniques.    Pt will specifically work  on: n/a   Recommended Physical Activity Goals Mansi has been advised to work up to 150 minutes of moderate intensity aerobic activity a week and strengthening exercises 2-3 times per week for cardiovascular health, weight loss maintenance and preservation of muscle mass.   She has agreed to : maintain current level of activity.    Pharmacotherapy We both agreed to : continue with nutritional and behavioral strategies   FOR ASSOCIATED CONDITIONS ADDRESSED TODAY:  Fatigue Assessment & Plan: Cheria does feel that her weight is causing her energy to be lower than it should be. Fatigue may be related to obesity, depression or many other causes. she does not appear to have any red flag symptoms and this appears to most likely be related to her current lifestyle habits and dietary intake.  Labs will be ordered and reviewed with her at their next office visit in two weeks.  Relevant Orders: -     EKG 12-Lead   Epworth sleepiness scale is 2 and appears to be OR not be (delete one) within normal limits. Carron admits to slight daytime somnolence and admits to waking up still tired. Patient has a history of symptoms of morning fatigue and morning headache. Ishi generally gets 5 or 6 hours of sleep per night, and states that she has generally unrestful sleep. Snoring is present. Apneic episodes are not present. Discussed proper sleep hygiene and importance of recreating these habits each and every night including but not limited to meditation before sleep and in the morning, blackout curtains, being properly hydrated, and avoiding blue light in the bed.    ECG: Performed and reviewed/ interpreted independently.  Normal sinus  rhythm, rate 69 bpm; reassuring without any acute abnormalities, will continue to monitor for symptoms    Shortness of breath on exertion Assessment & Plan: Emarie does feel that she gets out of breath more easily than she used to when she exercises  and seems to be worsening over time with weight gain.  This has gotten worse recently. Relda denies shortness of breath at rest or orthopnea. Pt denies chest pain, dizziness, heart palpitations, or excessive diaphoresis or nausea with activity.  This is not new and is ongoing.  Corbyn's shortness of breath appears to be obesity related and exercise induced, as they do not appear to have any "red flag" symptoms/ concerns today.  Also, this condition appears to be related to a state of poor cardiovascular conditioning   Obtain labs today and will be reviewed with her at their next office visit in two weeks.  Indirect Calorimeter completed today to help guide our dietary regimen. It shows a VO2 of 298 and a REE of 2059.  Her calculated basal metabolic rate is 7829 thus her resting energy expenditure is better than expected.  Patient agreed to work on weight loss at this time.  As Tacy progresses through our weight loss program, we will gradually increase exercise as tolerated to treat her current condition.   If Shavonte follows our recommendations and loses 5-10% of their weight without improvement of her shortness of breath or if at any time, symptoms become more concerning, they agree to urgently follow up with their PCP/specialist for further consideration/ evaluation.   Autymn verbalizes agreement with this plan.    Mood disorder (HCC) - emotional eating Depression Screen  Assessment & Plan: Her Food and Mood (modified PHQ-9) score was positive at 13. Jade's mood is currently stable, she denies any SI/HI. [facility] and has an appointment schedule on the 11th. Pt has been dealing with recent stressor about work She has been working from home since [employer], but her job is now requiring her to go back into the office in May. Pt is worried about how to manage work-life balance if she goes back into office  because she works in [place], [place]. Korrin admits to eating when stressed, sad, and to comfort herself. Referred to Dr. Dewaine Conger -- pt declines at this time and prefers to discuss with personal counselor first. May f/up at a later time. Reminded patient of the importance of following their prudent nutrition plan and how food can affect mood as well to support emotional wellbeing. Will continue to monitor alongside specialist.   Relevant Orders: -     Folate -     VITAMIN D 25 Hydroxy (Vit-D Deficiency, Fractures)   Pre-diabetes Hx of diet controlled gestational diabetes mellitus (GDM), antepartum Assessment & Plan: Camron has a h/o antepartum gestation DM. Her last A1c per PCP 3 months ago was 5.8. She has never been on medication for this condition. Pt is currently pregnant and understands she is high risk for GDM. Extensively discussed on importance of decreasing simple carbs and high fiber complex carbs with low sugar content. Explained importance of not gaining over 15 lbs in pregnancy as this will increase risk of complications and GDM. Started on cat 4 mp which requires more calories d/t her pregnancy. Will closely monitor fat mass, muscle mass, and overall body fat percentage. Will recheck labs today.   Relevant Orders: -     Hemoglobin A1c -  Insulin, random -     Lipid panel -     TSH   Idiopathic intracranial hypertension Assessment & Plan: BP Readings from Last 3 Encounters:  01/01/24 113/70  12/15/23 119/79  11/23/23 108/73  Tejal is not currently taking any medications for this condition. BP is at goal today. Pt saw Dr. Marjory Lies of neurology on 12/15/2023 for migraines and IIH. In the past, she was on Acetazolamide 250 mg twice daily. She also consulted with ophthalmology which did not show increased pressure in eyes. Neurologist recommended to take Tylenol as needed for this condition, and is safe for pregnancy. Encouraged pt to follow neurologist recommendations  and treatment plan. Will continue to monitor alongside neurology.   Relevant Orders: -     CBC with Differential/Platelet -     Comprehensive metabolic panel with GFR   Less than [redacted] weeks gestation of pregnancy Assessment & Plan: Nansi is currently about [redacted] weeks pregnant. She is taking prenatal vitamins daily. Encouraged pt to ask OBGYN about lunch meats, cheeses, and additional dietary restrictions during pregnancy. Also, ask about goal weight gain during pregnancy. Informed pt that she does not have to stop program d/t pregnancy. Stressed importance of checking with OBGYN before starting any additional medications. Will continue to monitor alongside specialist.   FOLLOW UP:   Follow up in 2 weeks. She was informed of the importance of frequent follow up visits to maximize her success with intensive lifestyle modifications for her multiple health conditions.  Ismahan Schaben is aware that we will review all of her lab results at our next visit.  She is aware that if anything is critical/ life threatening with the results, we will be contacting her via MyChart prior to the office visit to discuss management.    Chief Complaint:   OBESITY Prairie Stenberg (MR# [id number]) is a pleasant 33 y.o. female who presents for evaluation and treatment of obesity and related comorbidities. Current BMI is Body mass index is 38.11 kg/m. Dorrine Rote has been struggling with her weight for many years and has been unsuccessful in either losing weight, maintaining weight loss, or reaching her healthy weight goal.  Briggette Waldvogel is currently in the action stage of change and ready to dedicate time achieving and maintaining a healthier weight. Fareeda Siravo is interested in becoming our patient and working on intensive lifestyle modifications including (but not limited to) diet and exercise for weight loss.  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 Patient is engaged to [place] and has 2 children. She lives with her fiance, 1 y.o son Engineer, petroleum) and 64 y.o son (Adonis).  Cardio and strength training 1-2 times weekly for 1-2 hours  Desires to reach 175 lbs in no particular time -- was [employer]  Started gaining weight after having kids and leaving military  Heaviest 230 lbs  Never tried diet or weight loss programs  Eats out 4x week  Does not like to cook  Craves chicken salad and seafood  Works from home and snacks a lot during the day  Snacks on peanuts, fruits, and crackers  Sometimes skips breakfast  Drinks coffee with creamer/sweetener, juice, tea with honey, and fruit smoothies with oats  WFH: emotionally eating and overeating  Subjective:   This is the patient's first visit at [facility].  The patient's NEW PATIENT PACKET that they filled out prior to today's office visit was reviewed at length and information  from that paperwork was included within the following office visit note.    Included in the packet: current and past health history, medications, allergies, ROS, gynecologic history (women only), surgical history, family history, social history, weight history, weight loss surgery history (for those that have had weight loss surgery), nutritional evaluation, mood and food questionnaire along with a depression screening (PHQ9) on all patients, an Epworth questionnaire, sleep habits questionnaire, patient life and health improvement goals questionnaire. These will all be scanned into the patient's chart under the "media" tab.   Review of Systems: Please refer to new patient packet scanned into media. Pertinent positives were addressed with patient today.  Reviewed by clinician on day of visit: allergies, medications, problem list, medical history, surgical history, family history, social history, and previous encounter notes.  During the visit, I independently  reviewed the patient's EKG, bioimpedance scale results, and indirect calorimeter results. I used this information to tailor a meal plan for the patient that will help Amaziah Petitfrere to lose weight and will improve her obesity-related conditions going forward.  I performed a medically necessary appropriate examination and/or evaluation. I discussed the assessment and treatment plan with the patient. The patient was provided an opportunity to ask questions and all were answered. The patient agreed with the plan and demonstrated an understanding of the instructions. Labs were ordered today (unless patient declined them) and will be reviewed with the patient at our next visit unless more critical results need to be addressed immediately. Clinical information was updated and documented in the EMR.    Objective:   PHYSICAL EXAM: Blood pressure 113/70, pulse 75, temperature 98.1 F (36.7 C), height 5\' 4"  (1.626 m), weight 222 lb (100.7 kg), SpO2 100%, unknown if currently breastfeeding. Body mass index is 38.11 kg/m.  General: Well Developed, well nourished, and in no acute distress.  HEENT: Normocephalic, atraumatic; EOMI, sclerae are anicteric. Skin: Warm and dry, good turgor Chest:  Normal excursion, shape, no gross ABN Respiratory: No conversational dyspnea; speaking in full sentences NeuroM-Sk:  Normal gross ROM * 4 extremities  Psych: A and O *3, insight adequate, mood- full   Anthropometric Measurements Height: 5\' 4"  (1.626 m) Weight: 222 lb (100.7 kg) BMI (Calculated): 38.09 Weight at Last Visit: N/A Weight Lost Since Last Visit: N/A Weight Gained Since Last Visit: N/A Starting Weight: 222lb Peak Weight: 230lb Waist Measurement : 39 inches   Body Composition  Body Fat %: 42.4 % Fat Mass (lbs): 94.2 lbs Muscle Mass (lbs): 121.8 lbs Total Body Water (lbs): 86.2 lbs Visceral Fat Rating : 10   Other Clinical Data Fasting: Yes Labs: Yes Today's Visit #: 1 Starting Date:  01/01/24    DIAGNOSTIC DATA REVIEWED:  BMET    Component Value Date/Time   NA 141 11/01/2022 1707   K 3.8 11/01/2022 1707   CL 105 11/01/2022 1707   CO2 21 11/01/2022 1707   GLUCOSE 117 (H) 11/01/2022 1707   GLUCOSE 73 02/11/2022 1400   BUN 13 11/01/2022 1707   CREATININE 0.78 11/01/2022 1707   CALCIUM 9.0 11/01/2022 1707   GFRNONAA >60 02/11/2022 1400   Lab Results  Component Value Date   HGBA1C 5.8 (A) 09/28/2023   HGBA1C 5.8 (H) 09/22/2021   No results found for: "INSULIN" Lab Results  Component Value Date   TSH 0.861 09/28/2023   CBC    Component Value Date/Time   WBC 6.5 02/26/2023 1514   RBC 4.77 02/26/2023 1514   HGB 13.1 02/26/2023 1514   HGB  12.4 11/01/2022 1707   HCT 40.1 02/26/2023 1514   HCT 37.3 11/01/2022 1707   PLT 253 02/26/2023 1514   PLT 242 11/01/2022 1707   MCV 84.1 02/26/2023 1514   MCV 84 11/01/2022 1707   MCH 27.5 02/26/2023 1514   MCHC 32.7 02/26/2023 1514   RDW 13.4 02/26/2023 1514   RDW 13.1 11/01/2022 1707   Iron Studies No results found for: "IRON", "TIBC", "FERRITIN", "IRONPCTSAT" Lipid Panel  No results found for: "CHOL", "TRIG", "HDL", "CHOLHDL", "VLDL", "LDLCALC", "LDLDIRECT" Hepatic Function Panel     Component Value Date/Time   PROT 6.6 02/11/2022 1400   ALBUMIN 3.0 (L) 02/11/2022 1400   AST 16 02/11/2022 1400   ALT 14 02/11/2022 1400   ALKPHOS 68 02/11/2022 1400   BILITOT 0.4 02/11/2022 1400      Component Value Date/Time   TSH 0.861 09/28/2023 1413   TSH 0.701 02/11/2022 1400   Nutritional No results found for: "VD25OH"  Attestation Statements:   I, Camryn Mix, acting as a Stage manager for [employer], DO., have compiled all relevant documentation for today's office visit on behalf of Thomasene Lot, DO, while in the presence of [employer], DO.  Reviewed by clinician on day of visit: allergies, medications, problem list, medical history, surgical history, family history, social history, and  previous encounter notes pertinent to patient's obesity diagnosis. I have spent 62 minutes in the care of the patient today including: preparing to see patient (e.g. review and interpretation of tests, old notes ), obtaining and/or reviewing separately obtained history, performing a medically appropriate examination or evaluation, counseling and educating the patient, ordering medications, test or procedures, documenting clinical information in the electronic or other health care record, and independently interpreting results and communicating results to the patient, family, or caregiver   Specifically: Discussed and reviewed old neurology notes about ICH and migraines, gave dietary guidelines about pregnancy and following RCNP, gave guidance about "weight she should lose" and goal for decreasing body fat percentage, guidance on working from home and avoiding snacking during the day, guidance on improving mood, and guidance on improving sleep hygiene.   I have reviewed the above documentation for accuracy and completeness, and I agree with the above. Carlye Grippe, D.O.  The 21st Century Cures Act was signed into law in 2016 which includes the topic of electronic health records.  This provides immediate access to information in MyChart.  This includes consultation notes, operative notes, office notes, lab results and pathology reports.  If you have any questions about what you read please let us know at your next visit so we can discuss your concerns and take corrective action if need be.  We are right here with you.

## 2024-01-02 LAB — HEMOGLOBIN A1C
Est. average glucose Bld gHb Est-mCnc: 123 mg/dL
Hgb A1c MFr Bld: 5.9 % — ABNORMAL HIGH (ref 4.8–5.6)

## 2024-01-02 LAB — COMPREHENSIVE METABOLIC PANEL WITH GFR
ALT: 14 IU/L (ref 0–32)
AST: 12 IU/L (ref 0–40)
Albumin: 4.2 g/dL (ref 3.9–4.9)
Alkaline Phosphatase: 71 IU/L (ref 44–121)
BUN/Creatinine Ratio: 12 (ref 9–23)
BUN: 8 mg/dL (ref 6–20)
Bilirubin Total: 0.4 mg/dL (ref 0.0–1.2)
CO2: 22 mmol/L (ref 20–29)
Calcium: 9.3 mg/dL (ref 8.7–10.2)
Chloride: 102 mmol/L (ref 96–106)
Creatinine, Ser: 0.69 mg/dL (ref 0.57–1.00)
Globulin, Total: 3.3 g/dL (ref 1.5–4.5)
Glucose: 79 mg/dL (ref 70–99)
Potassium: 4 mmol/L (ref 3.5–5.2)
Sodium: 138 mmol/L (ref 134–144)
Total Protein: 7.5 g/dL (ref 6.0–8.5)
eGFR: 119 mL/min/{1.73_m2} (ref >59)

## 2024-01-02 LAB — LIPID PANEL
Chol/HDL Ratio: 3.1 ratio (ref 0.0–4.4)
Cholesterol, Total: 138 mg/dL (ref 100–199)
HDL: 45 mg/dL (ref >39)
LDL Chol Calc (NIH): 80 mg/dL (ref 0–99)
Triglycerides: 61 mg/dL (ref 0–149)
VLDL Cholesterol Cal: 13 mg/dL (ref 5–40)

## 2024-01-02 LAB — FOLATE: Folate: >20 ng/mL (ref >3.0)

## 2024-01-02 LAB — CBC WITH DIFFERENTIAL/PLATELET
Basophils Absolute: 0 10*3/uL (ref 0.0–0.2)
Basos: 0 %
EOS (ABSOLUTE): 0.2 10*3/uL (ref 0.0–0.4)
Eos: 2 %
Hematocrit: 42.3 % (ref 34.0–46.6)
Hemoglobin: 13.1 g/dL (ref 11.1–15.9)
Immature Grans (Abs): 0 10*3/uL (ref 0.0–0.1)
Immature Granulocytes: 0 %
Lymphocytes Absolute: 2.4 10*3/uL (ref 0.7–3.1)
Lymphs: 38 %
MCH: 26.7 pg (ref 26.6–33.0)
MCHC: 31 g/dL — ABNORMAL LOW (ref 31.5–35.7)
MCV: 86 fL (ref 79–97)
Monocytes Absolute: 0.5 10*3/uL (ref 0.1–0.9)
Monocytes: 7 %
Neutrophils Absolute: 3.2 10*3/uL (ref 1.4–7.0)
Neutrophils: 53 %
Platelets: 254 10*3/uL (ref 150–450)
RBC: 4.9 x10E6/uL (ref 3.77–5.28)
RDW: 13.3 % (ref 11.7–15.4)
WBC: 6.2 10*3/uL (ref 3.4–10.8)

## 2024-01-02 LAB — VITAMIN D 25 HYDROXY (VIT D DEFICIENCY, FRACTURES): Vit D, 25-Hydroxy: 32.3 ng/mL (ref 30.0–100.0)

## 2024-01-02 LAB — INSULIN, RANDOM: INSULIN: 19.8 u[IU]/mL (ref 2.6–24.9)

## 2024-01-02 LAB — TSH: TSH: 0.321 u[IU]/mL — ABNORMAL LOW (ref 0.450–4.500)

## 2024-01-15 ENCOUNTER — Encounter (INDEPENDENT_AMBULATORY_CARE_PROVIDER_SITE_OTHER): Payer: Self-pay | Admitting: Family Medicine

## 2024-01-15 ENCOUNTER — Ambulatory Visit (INDEPENDENT_AMBULATORY_CARE_PROVIDER_SITE_OTHER): Payer: Federal, State, Local not specified - PPO | Admitting: Family Medicine

## 2024-01-15 ENCOUNTER — Ambulatory Visit (INDEPENDENT_AMBULATORY_CARE_PROVIDER_SITE_OTHER): Payer: Federal, State, Local not specified - PPO | Admitting: Internal Medicine

## 2024-01-15 VITALS — BP 117/75 | HR 71 | Temp 98.0°F | Ht 64.0 in | Wt 223.0 lb

## 2024-01-15 DIAGNOSIS — E559 Vitamin D deficiency, unspecified: Secondary | ICD-10-CM

## 2024-01-15 DIAGNOSIS — R7303 Prediabetes: Secondary | ICD-10-CM

## 2024-01-15 DIAGNOSIS — E0590 Thyrotoxicosis, unspecified without thyrotoxic crisis or storm: Secondary | ICD-10-CM

## 2024-01-15 DIAGNOSIS — E669 Obesity, unspecified: Secondary | ICD-10-CM

## 2024-01-15 DIAGNOSIS — Z6838 Body mass index (BMI) 38.0-38.9, adult: Secondary | ICD-10-CM

## 2024-01-15 MED ORDER — VITAMIN D3 125 MCG (5000 UT) PO CAPS
5000.0000 [IU] | ORAL_CAPSULE | Freq: Every day | ORAL | Status: AC
Start: 2024-01-15 — End: ?

## 2024-01-15 NOTE — Progress Notes (Signed)
 Lori Compton, D.O.  ABFM, ABOM Clinical Bariatric Medicine Physician  Office located at: [address] [place], [place]  [address]     Assessment and Plan:  No orders of the defined types were placed in this encounter.  There are no discontinued medications.   Meds ordered this encounter  Medications   Cholecalciferol (VITAMIN D3) 125 MCG (5000 UT) CAPS    Sig: Take 1 capsule (5,000 Units total) by mouth daily.     FOR THE DISEASE OF OBESITY:  BMI 38.0-38.9,adult - Current 38.26 Obesity (BMI 30-39.9) -- starting BMI 38.09 Assessment & Plan: Since last office visit on 01/01/2024, patient's muscle mass has increased by 0.4 lbs. Fat mass has increased by 0.6 lbs. Total body water has decreased by 0.4 lbs.  Counseling done on how various foods will affect these numbers and how to maximize success  Total lbs lost to date: +1 lbs Total weight loss percentage to date: +0.45%    Recommended Dietary Goals Lori Compton is currently in the action stage of change. As such, her goal is to continue weight management plan.  She has agreed to: [employer] 1900-2000cal and 120+g protein using CAT 4 MP as a guide   Behavioral Intervention We discussed the following today: Brown rice alternatives, how to determine amount of carbs and protein in foods, work on tracking and journaling calories using tracking application  Additional resources provided today: Handout on food journaling plan, Handout on non-starchy vegetables, Handout on Examples of Low Glycemic Index and Low Calorie Fruits & Vegetables, Handout on Food Journaling Log, and Handout on insulin  resistance education  Evidence-based interventions for health behavior change were utilized today including the discussion of self monitoring techniques, problem-solving barriers and SMART goal setting techniques.  Regarding patient's less desirable eating habits and patterns, we employed the technique of small changes.   Pt  will specifically work on: Tracking food intake for next OV   Recommended Physical Activity Goals Lori Compton has been advised to work up to 150 minutes of moderate intensity aerobic activity a week and strengthening exercises 2-3 times per week for cardiovascular health, weight loss maintenance and preservation of muscle mass.   She has agreed to :  Think about enjoyable ways to increase daily physical activity and overcoming barriers to exercise   Pharmacotherapy We discussed various medication options to help Lori Compton with her weight loss efforts and we both agreed to : continue with nutritional and behavioral strategies   FOR ASSOCIATED CONDITIONS ADDRESSED TODAY:  Pre-diabetes Assessment & Plan: Lab Results  Component Value Date   HGBA1C 5.9 (H) 01/01/2024   HGBA1C 5.8 (A) 09/28/2023   HGBA1C 5.7 (A) 11/01/2022   INSULIN  19.8 01/01/2024   Lab Results  Component Value Date   CREATININE 0.69 01/01/2024   BUN 8 01/01/2024   NA 138 01/01/2024   K 4.0 01/01/2024   CL 102 01/01/2024   CO2 22 01/01/2024      Component Value Date/Time   PROT 7.5 01/01/2024 1235   ALBUMIN 4.2 01/01/2024 1235   AST 12 01/01/2024 1235   ALT 14 01/01/2024 1235   ALKPHOS 71 01/01/2024 1235   BILITOT 0.4 01/01/2024 1235   Lab Results  Component Value Date   WBC 6.2 01/01/2024   HGB 13.1 01/01/2024   HCT 42.3 01/01/2024   MCV 86 01/01/2024   PLT 254 01/01/2024   Lab Results  Component Value Date   CHOL 138 01/01/2024   HDL 45 01/01/2024   LDLCALC  80 01/01/2024   TRIG 61 01/01/2024   CHOLHDL 3.1 01/01/2024   Lab Results  Component Value Date   FOLATE >20.0 01/01/2024  Lori Compton is not taking any PreDM medications. Reviewed last obtained results, A1c slightly increased from 5.8 to 5.9. Insulin  levels are almost 4x the ideal goal. CBC, kidneys, liver, lipid, and folate levels all WNL.   Informed pt that A1c can increase with excess carbs in diet. Encouraged Lori Compton to let OBGYN  know that A1c has worsened and to consult about ways to better prevent gestational diabetes d/t current pregnancy. Explained role of insulin  in the body on blood sugars. Informed pt that folate levels are elevated d/t prenatal vitamins and this is not a cause for concern. Recommended to eat protein with every meal and decrease carbs to decrease A1c and insulin  levels.    Subclinical hyperthyroidism Assessment & Plan: Lab Results  Component Value Date   TSH 0.321 (L) 01/01/2024  Per labs obtained from [facility], TSH levels were low at 0.321. 2 months ago, her TSH levels were 0.382 which shows her levels have slightly decreased. Pt is asymptomatic. She saw Dr. Lydia Compton of endocrinology on 10/26/2023. Informed pt that hyperthyroidism can cause symptoms of hyperhidrosis, hyperactivity, palpitations, and anxiousness. Encouraged to talk to endocrinologist and OBGYN about low TSH levels and how to continue with treatment plan. Pt agrees to call endocrinologist today to make them aware of recent labs. Will continue to monitor alongside specialist.    Vitamin D  deficiency - new onset Assessment & Plan: Lab Results  Component Value Date   VD25OH 32.3 01/01/2024  This is a new diagnosis for Lori Compton. Per labs obtained during [facility], Vit D levels are low at 32.3. Informed pt of goal of 50-70. Recommended to ask OBGYN about taking OTC Vit D supplement 5000 units daily to ensure they are ok with it prior to starting. Educated pt of importance of optimal Vit D levels for mood, energy, and overall wellbeing. Will recheck levels in about 3 months to observe any changes.   Relevant Orders: -     Vitamin D3; Take 1 capsule (5,000 Units total) by mouth daily.   FOLLOW UP:   Return in about 22 days (around 02/06/2024). She was informed of the importance of frequent follow up visits to maximize her success with intensive lifestyle modifications for her multiple health conditions.  Subjective:   Chief complaint:  Obesity Lori Compton is here to discuss her progress with her obesity treatment plan. She is on the Category 4 Plan with B/L options and states she is following her eating plan approximately 25 % of the time. She states she is not exercising.  Interval History:  Lori Compton is here today for her first follow-up office visit since starting the program with us . Since last OV, pt is up 1 lb. Pt is currently pregnant and reports not having much energy to eat. Additionally, she is throwing up most foods that she does eat. Today, Katalena had banana bread for breakfast, but most times will eat eggs and [place] bacon.   All blood work/ lab tests that were recently ordered by myself or an outside provider were reviewed with patient today per their request. Extended time was spent counseling her on all new disease processes that were discovered or preexisting ones that are affected by BMI.  she understands that many of these abnormalities will need to monitored regularly along with the current treatment plan of prudent dietary changes, in which we are making each and  every office visit, to improve these health parameters.  Pharmacotherapy for weight loss: She is not currently taking medications  for medical weight loss.  Denies side effects.    Review of Systems:  Pertinent positives were addressed with patient today.  Reviewed by clinician on day of visit: allergies, medications, problem list, medical history, surgical history, family history, social history, and previous encounter notes.   Weight Summary and Biometrics   Weight Lost Since Last Visit: 0lb  Weight Gained Since Last Visit: 1lb   Vitals Temp: 98 F (36.7 C) BP: 117/75 Pulse Rate: 71 SpO2: 100 %   Anthropometric Measurements Height: 5\' 4"  (1.626 m) Weight: 223 lb (101.2 kg) BMI (Calculated): 38.26 Weight at Last Visit: 222lb Weight Lost Since Last Visit: 0lb Weight Gained Since Last Visit: 1lb Starting Weight:  222lb Total Weight Loss (lbs): 0 lb (0 kg) Peak Weight: 230lb Waist Measurement : 39 inches   Body Composition  Body Fat %: 42.4 % Fat Mass (lbs): 94.8 lbs Muscle Mass (lbs): 122.2 lbs Total Body Water (lbs): 85.8 lbs Visceral Fat Rating : 10   Other Clinical Data Fasting: No Labs: No Today's Visit #: 2 Starting Date: 01/01/24   Objective:   PHYSICAL EXAM:  Blood pressure 117/75, pulse 71, temperature 98 F (36.7 C), height 5\' 4"  (1.626 m), weight 223 lb (101.2 kg), last menstrual period 09/16/2023, SpO2 100%, unknown if currently breastfeeding. Body mass index is 38.28 kg/m.  General: she is overweight, cooperative and in no acute distress.   HEENT: EOMI, sclerae are anicteric. Lungs: Normal breathing effort, no conversational dyspnea. M-Sk:  Normal gross ROM * 4 extremities  PSYCH: Has normal mood, affect and thought process. Neurologic: No gross sensory or motor deficits. Well developed, A and O * 3  DIAGNOSTIC DATA REVIEWED:  BMET    Component Value Date/Time   NA 138 01/01/2024 1235   K 4.0 01/01/2024 1235   CL 102 01/01/2024 1235   CO2 22 01/01/2024 1235   GLUCOSE 79 01/01/2024 1235   GLUCOSE 73 02/11/2022 1400   BUN 8 01/01/2024 1235   CREATININE 0.69 01/01/2024 1235   CALCIUM 9.3 01/01/2024 1235   GFRNONAA >60 02/11/2022 1400   Lab Results  Component Value Date   HGBA1C 5.9 (H) 01/01/2024   HGBA1C 5.8 (H) 09/22/2021   Lab Results  Component Value Date   INSULIN  19.8 01/01/2024   Lab Results  Component Value Date   TSH 0.321 (L) 01/01/2024   CBC    Component Value Date/Time   WBC 6.2 01/01/2024 1235   WBC 6.5 02/26/2023 1514   RBC 4.90 01/01/2024 1235   RBC 4.77 02/26/2023 1514   HGB 13.1 01/01/2024 1235   HCT 42.3 01/01/2024 1235   PLT 254 01/01/2024 1235   MCV 86 01/01/2024 1235   MCH 26.7 01/01/2024 1235   MCH 27.5 02/26/2023 1514   MCHC 31.0 (L) 01/01/2024 1235   MCHC 32.7 02/26/2023 1514   RDW 13.3 01/01/2024 1235   Iron  Studies No results found for: "IRON", "TIBC", "FERRITIN", "IRONPCTSAT" Lipid Panel     Component Value Date/Time   CHOL 138 01/01/2024 1235   TRIG 61 01/01/2024 1235   HDL 45 01/01/2024 1235   CHOLHDL 3.1 01/01/2024 1235   LDLCALC 80 01/01/2024 1235   Hepatic Function Panel     Component Value Date/Time   PROT 7.5 01/01/2024 1235   ALBUMIN 4.2 01/01/2024 1235   AST 12 01/01/2024 1235   ALT 14 01/01/2024 1235  ALKPHOS 71 01/01/2024 1235   BILITOT 0.4 01/01/2024 1235      Component Value Date/Time   TSH 0.321 (L) 01/01/2024 1235   Nutritional Lab Results  Component Value Date   VD25OH 32.3 01/01/2024    Attestations:   I, Camryn Mix, acting as a Stage manager for [employer], DO., have compiled all relevant documentation for today's office visit on behalf of Lori Sensor, DO, while in the presence of [employer], DO.  Reviewed by clinician on day of visit: allergies, medications, problem list, medical history, surgical history, family history, social history, and previous encounter notes pertinent to patient's obesity diagnosis.   I have spent 48 minutes in the care of the patient today. Specifically: 4 minutes was spent before the visit reviewing the chart. 40 minutes was spent on face-to-face on all recent lab work, specific recommendations of following up with OBGYN and endocrinologist and why, extensive nutritional counseling as it pertains to her pregnancy, and additional counseling spent on diseases per above. 4 minutes was spent after the visit on additional documentation and chart review; 2 minutes spent on this additional documentation.   I have reviewed the above documentation for accuracy and completeness, and I agree with the above. Lori Compton, D.O.  The 21st Century Cures Act was signed into law in 2016 which includes the topic of electronic health records.  This provides immediate access to information in MyChart.  This includes consultation  notes, operative notes, office notes, lab results and pathology reports.  If you have any questions about what you read please let us  know at your next visit so we can discuss your concerns and take corrective action if need be.  We are right here with you.

## 2024-02-02 DIAGNOSIS — Z3480 Encounter for supervision of other normal pregnancy, unspecified trimester: Secondary | ICD-10-CM | POA: Diagnosis not present

## 2024-02-02 DIAGNOSIS — Z369 Encounter for antenatal screening, unspecified: Secondary | ICD-10-CM | POA: Diagnosis not present

## 2024-02-06 ENCOUNTER — Ambulatory Visit (INDEPENDENT_AMBULATORY_CARE_PROVIDER_SITE_OTHER): Admitting: Family Medicine

## 2024-02-22 DIAGNOSIS — E0590 Thyrotoxicosis, unspecified without thyrotoxic crisis or storm: Secondary | ICD-10-CM | POA: Diagnosis not present

## 2024-03-01 DIAGNOSIS — Z3482 Encounter for supervision of other normal pregnancy, second trimester: Secondary | ICD-10-CM | POA: Diagnosis not present

## 2024-03-01 DIAGNOSIS — Z369 Encounter for antenatal screening, unspecified: Secondary | ICD-10-CM | POA: Diagnosis not present

## 2024-03-01 DIAGNOSIS — R7309 Other abnormal glucose: Secondary | ICD-10-CM | POA: Diagnosis not present

## 2024-03-05 ENCOUNTER — Encounter: Payer: Self-pay | Admitting: *Deleted

## 2024-03-06 ENCOUNTER — Ambulatory Visit (INDEPENDENT_AMBULATORY_CARE_PROVIDER_SITE_OTHER): Admitting: Family Medicine

## 2024-03-28 DIAGNOSIS — Z363 Encounter for antenatal screening for malformations: Secondary | ICD-10-CM | POA: Diagnosis not present

## 2024-03-28 DIAGNOSIS — Z3480 Encounter for supervision of other normal pregnancy, unspecified trimester: Secondary | ICD-10-CM | POA: Diagnosis not present

## 2024-03-28 DIAGNOSIS — Z3A19 19 weeks gestation of pregnancy: Secondary | ICD-10-CM | POA: Diagnosis not present

## 2024-04-25 DIAGNOSIS — Z3A23 23 weeks gestation of pregnancy: Secondary | ICD-10-CM | POA: Diagnosis not present

## 2024-04-25 DIAGNOSIS — Z369 Encounter for antenatal screening, unspecified: Secondary | ICD-10-CM | POA: Diagnosis not present

## 2024-05-03 ENCOUNTER — Inpatient Hospital Stay (HOSPITAL_COMMUNITY)
Admission: AD | Admit: 2024-05-03 | Discharge: 2024-05-03 | Disposition: A | Discharge status: Home / Self Care | Attending: Obstetrics and Gynecology | Admitting: Obstetrics and Gynecology

## 2024-05-03 ENCOUNTER — Encounter (HOSPITAL_COMMUNITY): Payer: Self-pay | Admitting: *Deleted

## 2024-05-03 DIAGNOSIS — Z3A25 25 weeks gestation of pregnancy: Secondary | ICD-10-CM | POA: Diagnosis not present

## 2024-05-03 DIAGNOSIS — B9689 Other specified bacterial agents as the cause of diseases classified elsewhere: Secondary | ICD-10-CM | POA: Insufficient documentation

## 2024-05-03 DIAGNOSIS — O23592 Infection of other part of genital tract in pregnancy, second trimester: Secondary | ICD-10-CM | POA: Insufficient documentation

## 2024-05-03 DIAGNOSIS — Z3689 Encounter for other specified antenatal screening: Secondary | ICD-10-CM | POA: Insufficient documentation

## 2024-05-03 DIAGNOSIS — Z0371 Encounter for suspected problem with amniotic cavity and membrane ruled out: Secondary | ICD-10-CM | POA: Diagnosis not present

## 2024-05-03 LAB — URINALYSIS, ROUTINE W REFLEX MICROSCOPIC
Bilirubin Urine: NEGATIVE
Glucose, UA: NEGATIVE mg/dL
Hgb urine dipstick: NEGATIVE
Ketones, ur: NEGATIVE mg/dL
Leukocytes,Ua: NEGATIVE
Nitrite: NEGATIVE
Protein, ur: NEGATIVE mg/dL
Specific Gravity, Urine: 1.003 — ABNORMAL LOW (ref 1.005–1.030)
pH: 7 (ref 5.0–8.0)

## 2024-05-03 LAB — WET PREP, GENITAL
Sperm: NONE SEEN
Trich, Wet Prep: NONE SEEN
WBC, Wet Prep HPF POC: <10 (ref <10)
Yeast Wet Prep HPF POC: NONE SEEN

## 2024-05-03 LAB — POCT FERN TEST: POCT Fern Test: NEGATIVE

## 2024-05-03 LAB — GC/CHLAMYDIA PROBE AMP (~~LOC~~) NOT AT ARMC
Chlamydia: NEGATIVE
Comment: NEGATIVE
Comment: NORMAL
Neisseria Gonorrhea: NEGATIVE

## 2024-05-03 LAB — RUPTURE OF MEMBRANE (ROM)PLUS: Rom Plus: NEGATIVE

## 2024-05-03 MED ORDER — METRONIDAZOLE 500 MG PO TABS: 500.0000 mg | ORAL_TABLET | Freq: Two times a day (BID) | ORAL | 0 refills | Status: DC

## 2024-05-03 NOTE — MAU Provider Note (Signed)
 History     CSN: [id number]  Arrival date and time: 05/03/24 0018   Event Date/Time   First Provider Initiated Contact with Patient 05/03/24 0121      Chief Complaint  Patient presents with   Vaginal Discharge   Lori Compton is a 33 y.o. [id number] at [redacted]w[redacted]d who receives care at [facility].  Patient reports her next appt is May 22, 2024  She presents today for leaking of fluid.  She states she has been having big gushes of fluid since Wednesday.  Patient states the fluid has been clear, but was yellow on one occasion.  Patient states she is unsure if the fluid has an odor and it does not cause irritation.  She denies vaginal bleeding or discharge. She denies recent sexual activity. Patient endorses fetal movement. She denies abdominal cramping or contractions.  She does report some BH contractions that started ~ 3 weeks ago.    OB History     Gravida  5   Para  2   Term  2   Preterm  0   AB  1   Living  2      SAB  0   IAB  1   Ectopic  0   Multiple  0   Live Births  2           Past Medical History:  Diagnosis Date   Anxiety    Back pain    Depression    Fibroid    Gestational diabetes    Idiopathic intracranial hypertension    Migraine    Pre-diabetes     Past Surgical History:  Procedure Laterality Date   OOPHORECTOMY Right 2019   WISDOM TOOTH EXTRACTION      Family History  Problem Relation Age of Onset   Obesity Mother    Anxiety disorder Mother    Depression Mother    Thyroid  disease Mother    Hypertension Mother    Heart Problems Maternal Aunt    Breast cancer Maternal Grandmother    Hypertension Maternal Grandmother     Social History   Tobacco Use   Smoking status: Never   Smokeless tobacco: Never  Vaping Use   Vaping status: Never Used  Substance Use Topics   Alcohol use: Not Currently    Alcohol/week: 2.0 standard drinks of alcohol    Types: 1 Glasses of wine, 1 Cans of beer per week    Comment: either  beer or wine, 1/week   Drug use: Never    Allergies: No Known Allergies  Medications Prior to Admission  Medication Sig Dispense Refill Last Dose/Taking   buPROPion  (WELLBUTRIN  XL) 300 MG 24 hr tablet Take 300 mg by mouth daily.   05/02/2024   Cholecalciferol (VITAMIN D3) 125 MCG (5000 UT) CAPS Take 1 capsule (5,000 Units total) by mouth daily.   05/02/2024   Prenatal Vit-Fe Fumarate-FA (PRENATAL PO) Take 1 tablet by mouth daily.   05/02/2024    Review of Systems  Gastrointestinal:  Negative for abdominal pain, nausea and vomiting.  Genitourinary:  Negative for difficulty urinating, dysuria, vaginal bleeding and vaginal discharge.   Physical Exam   Blood pressure 130/79, pulse 94, temperature 98.4 F (36.9 C), resp. rate 17, height 5' 4 (1.626 m), weight 110.2 kg, last menstrual period 09/16/2023, SpO2 96%, unknown if currently breastfeeding.  Physical Exam Vitals and nursing note reviewed. Exam conducted with a chaperone present Delorse, RN).  Constitutional:      Appearance: Normal  appearance.  HENT:     Head: Normocephalic and atraumatic.  Eyes:     Conjunctiva/sclera: Conjunctivae normal.  Cardiovascular:     Rate and Rhythm: Normal rate.  Pulmonary:     Effort: Pulmonary effort is normal. No respiratory distress.  Abdominal:     Palpations: Abdomen is soft.     Tenderness: There is no abdominal tenderness.  Genitourinary:    General: Normal vulva.     Comments: Sterile Speculum Exam: -Normal External Genitalia: Non tender, no apparent discharge or fluid at introitus. Labia with mild erythema bilaterally.  -Vaginal Vault: Pink mucosa with good rugae. No pooling noted. Negative valsalva. Scant amt white discharge -wet prep and ROM plus collected -Cervix:Pink, no lesions, cysts, or polyps.  Appears closed. No active bleeding or leaking from os-GC/CT collected -Bimanual Exam:  Deferred Musculoskeletal:        General: Normal range of motion.     Cervical back: Normal range of  motion.  Skin:    General: Skin is warm and dry.  Neurological:     Mental Status: She is alert and oriented to person, place, and time.  Psychiatric:        Mood and Affect: Mood normal.        Behavior: Behavior normal.     Fetal Assessment 145 bpm, Mod Var, -Decels, -Accels Toco: No ctx graphed  MAU Course   Results for orders placed or performed during the hospital encounter of 05/03/24 (from the past 24 hours)  Urinalysis, Routine w reflex microscopic -Urine, Clean Catch     Status: Abnormal   Collection Time: 05/03/24 12:45 AM  Result Value Ref Range   Color, Urine YELLOW YELLOW   APPearance CLOUDY (A) CLEAR   Specific Gravity, Urine 1.003 (L) 1.005 - 1.030   pH 7.0 5.0 - 8.0   Glucose, UA NEGATIVE NEGATIVE mg/dL   Hgb urine dipstick NEGATIVE NEGATIVE   Bilirubin Urine NEGATIVE NEGATIVE   Ketones, ur NEGATIVE NEGATIVE mg/dL   Protein, ur NEGATIVE NEGATIVE mg/dL   Nitrite NEGATIVE NEGATIVE   Leukocytes,Ua NEGATIVE NEGATIVE   RBC / HPF 0-5 0 - 5 RBC/hpf   WBC, UA 0-5 0 - 5 WBC/hpf   Bacteria, UA RARE (A) NONE SEEN   Squamous Epithelial / HPF 11-20 0 - 5 /HPF  Rupture of Membrane (ROM) Plus     Status: None   Collection Time: 05/03/24  1:14 AM  Result Value Ref Range   Rom Plus NEGATIVE   Wet prep, genital     Status: Abnormal   Collection Time: 05/03/24  1:14 AM  Result Value Ref Range   Yeast Wet Prep HPF POC NONE SEEN NONE SEEN   Trich, Wet Prep NONE SEEN NONE SEEN   Clue Cells Wet Prep HPF POC PRESENT (A) NONE SEEN   WBC, Wet Prep HPF POC <10 <10   Sperm NONE SEEN   Fern Test     Status: Normal   Collection Time: 05/03/24  1:33 AM  Result Value Ref Range   POCT Fern Test Negative = intact amniotic membranes    No results found.   MDM PE Labs: ROM+, UA Cultures: Wet Prep, GC/CT EFM  Assessment and Plan   33 year old [id number]  SIUP at 25 weeks Cat I FT R/O pPROM  -POC Reviewed -Exam performed and findings discussed. -Informed that findings  are not suspicious for ROM.  -Discussed labs collected and sent. -Informed if ROM + returns positive will send for US  to  assess amniotic fluid index as means of further confirmation.  -Patient without questions and agreeable to plan.   -NST reactive for GA.  -Await results.    Harlene LITTIE Duncans MSN, CNM 05/03/2024, 1:21 AM   Reassessment (2:34 AM) -Results as above. -Provider to bedside to discuss. -Informed of clue cells c/w BV.  -Discussed treatment and patient opts for oral administration. -Rx sent to pharmacy on file.  -Encouraged to call primary office or return to MAU if symptoms worsen or with the onset of new symptoms. -Discharged to home in stable condition.  Harlene LITTIE Duncans MSN, CNM Advanced Practice Provider, [facility] [employer]

## 2024-05-03 NOTE — MAU Note (Signed)
 Lori Compton is a 33 y.o. at [redacted]w[redacted]d here in MAU reporting since Weds night pt reports leaking clear fld that is soaking her panty liner. This has gone on continuously since Weds night. Fld is clear. No VB and no pain. Reports good FM. States just laying in bed she feels gushes of fld  LMP: na Onset of complaint: Weds night Pain score: 0 Vitals:   05/03/24 0031 05/03/24 0034  BP:  131/76  Pulse: 91   Resp: 17   Temp: 98.4 F (36.9 C)   SpO2: 99%      FHT: 145  Lab orders placed from triage: u/a

## 2024-05-23 DIAGNOSIS — O360990 Maternal care for other rhesus isoimmunization, unspecified trimester, not applicable or unspecified: Secondary | ICD-10-CM | POA: Diagnosis not present

## 2024-05-23 DIAGNOSIS — Z23 Encounter for immunization: Secondary | ICD-10-CM | POA: Diagnosis not present

## 2024-05-23 DIAGNOSIS — O360930 Maternal care for other rhesus isoimmunization, third trimester, not applicable or unspecified: Secondary | ICD-10-CM | POA: Diagnosis not present

## 2024-05-23 DIAGNOSIS — Z3A27 27 weeks gestation of pregnancy: Secondary | ICD-10-CM | POA: Diagnosis not present

## 2024-06-21 DIAGNOSIS — Z3A32 32 weeks gestation of pregnancy: Secondary | ICD-10-CM | POA: Diagnosis not present

## 2024-06-21 DIAGNOSIS — O99213 Obesity complicating pregnancy, third trimester: Secondary | ICD-10-CM | POA: Diagnosis not present

## 2024-06-21 DIAGNOSIS — O26843 Uterine size-date discrepancy, third trimester: Secondary | ICD-10-CM | POA: Diagnosis not present

## 2024-07-03 DIAGNOSIS — Z3A33 33 weeks gestation of pregnancy: Secondary | ICD-10-CM | POA: Diagnosis not present

## 2024-07-03 DIAGNOSIS — Z3490 Encounter for supervision of normal pregnancy, unspecified, unspecified trimester: Secondary | ICD-10-CM | POA: Diagnosis not present

## 2024-07-03 DIAGNOSIS — O23593 Infection of other part of genital tract in pregnancy, third trimester: Secondary | ICD-10-CM | POA: Diagnosis not present

## 2024-07-03 DIAGNOSIS — B9689 Other specified bacterial agents as the cause of diseases classified elsewhere: Secondary | ICD-10-CM | POA: Diagnosis not present

## 2024-07-03 DIAGNOSIS — O23599 Infection of other part of genital tract in pregnancy, unspecified trimester: Secondary | ICD-10-CM | POA: Diagnosis not present

## 2024-07-16 DIAGNOSIS — Z3480 Encounter for supervision of other normal pregnancy, unspecified trimester: Secondary | ICD-10-CM | POA: Diagnosis not present

## 2024-07-16 DIAGNOSIS — Z3A35 35 weeks gestation of pregnancy: Secondary | ICD-10-CM | POA: Diagnosis not present

## 2024-07-22 ENCOUNTER — Inpatient Hospital Stay (HOSPITAL_COMMUNITY)
Admission: AD | Admit: 2024-07-22 | Discharge: 2024-07-23 | Disposition: A | Discharge status: Home / Self Care | Attending: Obstetrics and Gynecology | Admitting: Obstetrics and Gynecology

## 2024-07-22 ENCOUNTER — Encounter (HOSPITAL_COMMUNITY): Payer: Self-pay | Admitting: Obstetrics and Gynecology

## 2024-07-22 DIAGNOSIS — O4703 False labor before 37 completed weeks of gestation, third trimester: Secondary | ICD-10-CM | POA: Insufficient documentation

## 2024-07-22 DIAGNOSIS — O479 False labor, unspecified: Secondary | ICD-10-CM

## 2024-07-22 DIAGNOSIS — Z3A36 36 weeks gestation of pregnancy: Secondary | ICD-10-CM | POA: Insufficient documentation

## 2024-07-22 NOTE — MAU Note (Signed)
 Pt says she started leaking fluid at 1938- stood-up - fluid coming - clear . Still feels fluid coming out. PNC- [place] At 35 weeks - VE- closed. Denies HSV GBS- not collected. Feels UC's - stronger at 10pm- 5/10 Pain - now same  Feels baby moving. Last sex- 2 weeks ago

## 2024-07-23 DIAGNOSIS — O479 False labor, unspecified: Secondary | ICD-10-CM

## 2024-07-23 DIAGNOSIS — Z3A36 36 weeks gestation of pregnancy: Secondary | ICD-10-CM | POA: Diagnosis not present

## 2024-07-23 DIAGNOSIS — O4703 False labor before 37 completed weeks of gestation, third trimester: Secondary | ICD-10-CM | POA: Diagnosis present

## 2024-07-23 LAB — POCT FERN TEST: POCT Fern Test: NEGATIVE

## 2024-07-23 LAB — RUPTURE OF MEMBRANE (ROM)PLUS: Rom Plus: NEGATIVE

## 2024-07-23 NOTE — Discharge Instructions (Signed)
 Please go to the MAU (Maternity Assessment Unit) at [facility] if you experience vaginal bleeding,leaking/gush of fluid like your water broke, notice decreased movement from your baby after doing kick counts, or contractions the are becoming more intense or more frequent.   Fetal Movement Counts When you're pregnant, you might start feeling your baby move around the middle of your pregnancy. At first, these movements might feel like flutters, rolls, or swishes. As your baby grows, you might feel more kicks and jabs. Around week 28 of your pregnancy, your health care team may ask you to count how often your baby moves. This is important for all pregnancies, but especially for high-risk ones. Counting movements can help lessen the risk of stillbirth.  What is a fetal movement count? A fetal movement count is the number of times that you feel your baby move during a certain amount of time. This may also be called a kick count. There are many ways to do a kick count. Ask your team what is best for you. Pay attention to when your baby is most active. You may notice your baby's sleep and wake cycles. You may also notice things that make your baby move more. When you do a kick count, try to do it: When your baby is normally most active. At the same time each day.  How do I count fetal movements? Find a quiet, comfortable area. Sit or lie down. Write down the date, the start time, and the number of movements you feel. Count kicks, flutters, swishes, rolls, and jabs. Usually, you will feel at least 10 movements within 2 hours. Stop counting after you have felt 10 movements or if you have been counting for 2 hours. Write down the stop time.  Contact a health care provider if:  You don't feel 10 movements in 2 hours. Your baby isn't moving as it usually does. Your baby isn't moving at all. If you're not able to reach your provider, go to an emergency room. This information is not intended to  replace advice given to you by your health care provider. Make sure you discuss any questions you have with your health care provider.  Document Revised: 10/06/2023 Document Reviewed: 09/28/2022 Elsevier Patient Education  2025 [employer].  Signs and Symptoms of Labor Labor is the body's natural process of moving the baby and the placenta out of the uterus. The process of labor usually starts when the baby is full-term, 37 weeks and 0 days or more.   As your body prepares for labor and the birth of your baby, you may notice the following symptoms in the weeks and days before true labor starts: Your baby dropping lower into your pelvis to get into position for birth (lightening). When this happens, you may feel more pressure on your bladder and pelvic bone and less pressure on your ribs. This may make it easier to breathe. It may also cause you to need to urinate more often and have problems with bowel movements. Having practice contractions, also called Braxton Hicks contractions or false labor. These occur at irregular (unevenly spaced) intervals that are more than 10 minutes apart. False labor contractions are common after exercise or sexual activity. They will stop if you change position, rest, or drink fluids. These contractions are usually mild and do not get stronger over time. They may feel like: A backache or back pain. Mild cramps, similar to menstrual cramps. Tightening or pressure in your abdomen. Passing a small amount of  thick, bloody mucus from your vagina. This is called normal bloody show or losing your mucus plug. This may happen more than a week before labor begins, or right before labor begins, as the opening of the cervix starts to widen (dilate). For some women, the entire mucus plug passes at once. For others, pieces of the mucus plug may gradually pass over several days.  Signs and symptoms that labor has begun Signs that you are in labor may include: Having  contractions that come at regular (evenly spaced) intervals and increase in intensity. This may feel like more intense tightening or pressure in your abdomen that moves to your back. Feeling pressure in the vaginal area. Your water breaking (called rupture of membranes). This is when the sac of fluid that surrounds your baby breaks. Fluid leaking from your vagina may be clear or blood-tinged. Labor usually starts within 24 hours of your water breaking, but it may take longer to begin. Some people may feel a sudden gush of fluid; others may notice repeatedly damp underwear.  Go to the hospital when  Your water breaks. Your labor has started: painful, regular contractions that are 5 minutes apart or less. You have a fever. You have bright red blood coming from your vagina. You do not feel your baby moving. You have a severe headache with or without vision problems. You have chest pain or shortness of breath. These symptoms may represent a serious problem that is an emergency. Do not wait to see if the symptoms will go away. Get medical help right away. Call your local emergency services (911 in the [place] Do not drive yourself to the hospital.  Summary Labor is your body's natural process of moving your baby and the placenta out of your uterus. The process of labor usually starts when your baby is full-term When labor starts, or if your water breaks, call your health care provider or nurse care line. Based on your situation, they will determine when you should go in for an exam. This information is not intended to replace advice given to you by your health care provider. Make sure you discuss any questions you have with your health care provider.  Document Revised: 01/26/2021 Document Reviewed: 01/26/2021-- adapted Elsevier Patient Education  2024 [employer].

## 2024-07-23 NOTE — MAU Provider Note (Signed)
 S: Patient is here for RN labor evaluation. Fetal tracing, vital signs, & chart reviewed   O:  Vitals:   07/22/24 2320  BP: 127/70  Pulse: 83  Resp: 14  Temp: 98.4 F (36.9 C)  TempSrc: Oral  Weight: 109.4 kg  Height: 5' 4 (1.626 m)   Results for orders placed or performed during the hospital encounter of 07/22/24 (from the past 24 hours)  Rupture of Membrane (ROM) Plus     Status: None   Collection Time: 07/23/24 12:03 AM  Result Value Ref Range   Rom Plus NEGATIVE   POCT fern test     Status: Normal   Collection Time: 07/23/24 12:04 AM  Result Value Ref Range   POCT Fern Test Negative = intact amniotic membranes     Dilation: 1 Effacement (%): Thick Station: -3 Presentation: Vertex Exam by:: Alondra Twitty RN   FHR: 140 bpm, Mod Var, -Decels, +Accels UC: q3-4   A: 1. False labor   2. [redacted] weeks gestation of pregnancy      P:  RN to discharge home in stable condition with return precautions & fetal kick counts  [employer], DO 1:05 AM

## 2024-10-16 ENCOUNTER — Encounter: Payer: Self-pay | Admitting: Family Medicine

## 2024-10-16 ENCOUNTER — Ambulatory Visit: Payer: Self-pay | Admitting: Family Medicine

## 2024-10-16 VITALS — BP 125/78 | HR 79 | Ht 64.0 in | Wt 234.8 lb

## 2024-10-16 DIAGNOSIS — G43909 Migraine, unspecified, not intractable, without status migrainosus: Secondary | ICD-10-CM | POA: Insufficient documentation

## 2024-10-16 DIAGNOSIS — G44219 Episodic tension-type headache, not intractable: Secondary | ICD-10-CM | POA: Diagnosis present

## 2024-10-16 NOTE — Progress Notes (Signed)
" ° ° °  SUBJECTIVE:   CHIEF COMPLAINT / HPI:   Headaches --Started about 2 weeks ago --Last a few seconds to maybe a minute --Self resolves without any medication --hx migraines, this feels more like a tension headache across forehead --Also endorses a muffled like sensation in her ears but no hearing loss or drainage from ears --Symptoms are positional at times i.e. with turning head in bed or bending down to pick standing up but also can occur without position change --Notes mild dizziness sometimes with the onset of the headache --Has been treated for presumptive IIH in the past, saw neurology in March 2025, diagnosed with migraine, discontinued acetazolamide  and triptan at that time due to pregnancy --Only recent med changes are probiotic supplement, magnesium supplement in the last 6 weeks --Feels she is staying well-hydrated --no vision change, vertigo, lightheadedness, palpitations --no recent illnesses, reports chronic mild congestion --breastfeeding -- not taking wellbutrin  anymore  PERTINENT  PMH / PSH: Migraines,  OBJECTIVE:   BP 125/78   Pulse 79   Ht 5' 4 (1.626 m)   Wt 234 lb 12.8 oz (106.5 kg)   LMP 09/05/2024 (Exact Date)   SpO2 98%   Breastfeeding Yes   BMI 40.30 kg/m   General: Awake and conversant, no acute distress HEENT: Clear conjunctiva bilaterally, MMM, normal ear canals and TM bilaterally without cerumen Respiratory: Normal work of breathing on room air Neuro: PERRLA, CN II through XII without abnormalities, normal strength and sensation all extremities Psych: Appropriate mood and affect  ASSESSMENT/PLAN:   Assessment & Plan Episodic tension-type headache, not intractable 2 weeks of very short-lived tension type headache with associated mild dizziness and muffled hearing.  No abnormalities on neurologic or HEENT exam.  Given benign exam and self resolving symptoms, will monitor for now.  Advised patient to follow-up with neuro if symptoms persist or  worsen in the next few weeks.  Advised scheduling follow-up appointment at [facility] as needed.     Rea Raring, MD [facility] "

## 2024-10-16 NOTE — Patient Instructions (Signed)
 Thank you for coming in today! Here is a summary of what we discussed:  - Lets keep an eye on your symptoms for now.  Please let us  know if they continue or worsen.  I think it would be appropriate for you to go back to the neurologist if so  - You can schedule an appointment with us  in a few weeks and cancel if your symptoms resolve  Please call the clinic at [phone] if your symptoms worsen or you have any concerns.  Best, Dr Adele
